# Patient Record
Sex: Female | Born: 1955 | Race: White | Hispanic: No | Marital: Married | Smoking: Never smoker
Health system: Southern US, Community
[De-identification: ages and names within clinical notes are randomized; demographics above are authoritative.]

## PROBLEM LIST (undated history)

## (undated) DIAGNOSIS — I10 Essential (primary) hypertension: Secondary | ICD-10-CM

## (undated) DIAGNOSIS — E119 Type 2 diabetes mellitus without complications: Secondary | ICD-10-CM

## (undated) DIAGNOSIS — E785 Hyperlipidemia, unspecified: Secondary | ICD-10-CM

## (undated) HISTORY — DX: Type 2 diabetes mellitus without complications: E11.9

## (undated) HISTORY — PX: WRIST SURGERY: SHX841

## (undated) HISTORY — PX: SPINE SURGERY: SHX786

## (undated) HISTORY — DX: Essential (primary) hypertension: I10

## (undated) HISTORY — PX: EYE SURGERY: SHX253

## (undated) HISTORY — DX: Hyperlipidemia, unspecified: E78.5

## (undated) HISTORY — PX: CHOLECYSTECTOMY: SHX55

---

## 2010-06-19 ENCOUNTER — Ambulatory Visit: Payer: Self-pay | Admitting: Internal Medicine

## 2018-10-16 LAB — CBC AND DIFFERENTIAL
HCT: 47 — AB (ref 36–46)
Hemoglobin: 15.2 (ref 12.0–16.0)
Neutrophils Absolute: 5
Platelets: 222 (ref 150–399)
WBC: 7.6

## 2018-10-16 LAB — BASIC METABOLIC PANEL
BUN: 10 (ref 4–21)
Creatinine: 0.7 (ref 0.5–1.1)
Glucose: 169
Potassium: 3.3 — AB (ref 3.4–5.3)
Sodium: 139 (ref 137–147)

## 2018-10-16 LAB — HEPATIC FUNCTION PANEL
ALT: 37 — AB (ref 7–35)
AST: 22 (ref 13–35)
Alkaline Phosphatase: 67 (ref 25–125)
Bilirubin, Total: 0.4

## 2018-10-16 LAB — LIPID PANEL
Cholesterol: 228 — AB (ref 0–200)
HDL: 43 (ref 35–70)
LDL Cholesterol: 158
Triglycerides: 136 (ref 40–160)

## 2018-11-12 LAB — FECAL OCCULT BLOOD, IMMUNOCHEMICAL: IFOBT: NEGATIVE

## 2019-12-09 ENCOUNTER — Encounter: Payer: Self-pay | Admitting: Adult Health

## 2019-12-09 ENCOUNTER — Other Ambulatory Visit: Payer: Self-pay

## 2019-12-09 ENCOUNTER — Ambulatory Visit (INDEPENDENT_AMBULATORY_CARE_PROVIDER_SITE_OTHER): Payer: Federal, State, Local not specified - PPO | Admitting: Adult Health

## 2019-12-09 VITALS — BP 141/81 | HR 91 | Temp 99.4°F | Ht 67.75 in | Wt 202.0 lb

## 2019-12-09 DIAGNOSIS — Z01419 Encounter for gynecological examination (general) (routine) without abnormal findings: Secondary | ICD-10-CM | POA: Diagnosis not present

## 2019-12-09 DIAGNOSIS — E119 Type 2 diabetes mellitus without complications: Secondary | ICD-10-CM | POA: Diagnosis not present

## 2019-12-09 DIAGNOSIS — IMO0002 Reserved for concepts with insufficient information to code with codable children: Secondary | ICD-10-CM | POA: Insufficient documentation

## 2019-12-09 DIAGNOSIS — Z0000 Encounter for general adult medical examination without abnormal findings: Secondary | ICD-10-CM | POA: Diagnosis not present

## 2019-12-09 DIAGNOSIS — F411 Generalized anxiety disorder: Secondary | ICD-10-CM

## 2019-12-09 DIAGNOSIS — I152 Hypertension secondary to endocrine disorders: Secondary | ICD-10-CM

## 2019-12-09 DIAGNOSIS — E1159 Type 2 diabetes mellitus with other circulatory complications: Secondary | ICD-10-CM

## 2019-12-09 DIAGNOSIS — I10 Essential (primary) hypertension: Secondary | ICD-10-CM

## 2019-12-09 DIAGNOSIS — Z79899 Other long term (current) drug therapy: Secondary | ICD-10-CM | POA: Diagnosis not present

## 2019-12-09 DIAGNOSIS — E1140 Type 2 diabetes mellitus with diabetic neuropathy, unspecified: Secondary | ICD-10-CM | POA: Insufficient documentation

## 2019-12-09 DIAGNOSIS — E1165 Type 2 diabetes mellitus with hyperglycemia: Secondary | ICD-10-CM | POA: Insufficient documentation

## 2019-12-09 LAB — POCT GLYCOSYLATED HEMOGLOBIN (HGB A1C): Hemoglobin A1C: 7.9 % — AB (ref 4.0–5.6)

## 2019-12-09 MED ORDER — METFORMIN HCL ER 500 MG PO TB24: ORAL_TABLET | ORAL | 0 refills | Status: DC

## 2019-12-09 MED ORDER — CYCLOBENZAPRINE HCL 10 MG PO TABS: 10.0000 mg | ORAL_TABLET | Freq: Three times a day (TID) | ORAL | 0 refills | Status: DC | PRN

## 2019-12-09 NOTE — Assessment & Plan Note (Signed)
Continue all medications as directed, with the following change- Metformin 500mg - take 2 tablets (1000mg ) in the morning, 1 tablet in the evening- take with meals. Please call your insurance and inquire which glucometer is covered- let us know which for [place] to call in. Remain well hydrated, follow Diabetic diet. Please schedule fasting lab appt on Monday (4 Jan), office visit in 6 weeks. Continue to social distance and wear a mask when in public.

## 2019-12-09 NOTE — Progress Notes (Signed)
Subjective:    Patient ID: Susan Sandoval, female    DOB: Sep 26, 1956, 63 y.o.   MRN: [id number]  HPI:Susan Sandoval is here to establish as a new pt.  She is a pleasant 63 yr old female.  PMH: HTN, T2D, Anxiety She was started on Metformin 500mg  QD last yr when she ws initially dx'd with diabetes- she is unsure of what her A1c was then and has not had it checked since then, She has not been checking her BG at home-does not have glucometer. She and her husband moved from [place] to local area 2 months ago. They were living in hotel, eating fast food, she was unable to exercise regularly- gained 5 lbs and reports increase in bil lower ext neuropathy around the same time. She has hx of discectomy/laminectomy L4-L5 1994 that will cause some RLE numbenss, "but not like this". Denies acute accident/injury prior to onset of sx'- however has been lifting heavy boxes ie recent move. She reports ambulatory BP SBP 130s DBP 70s She denies acute cardiac sx's Weather permitting- she enjoys walking and swimming. Now that they have moved into their new home- they will resume healthy, home cooking and increase regular walking. Goal wt <mid 190s Current wt 202 Body mass index is 30.94 kg/m.  Per pt- she is statin intolerant- caused PACs and "crawlings up my leg sensations"- discussed referral to lipid clinic. She reports long standing hx of Anxiety- not currently on medication, declined referral to BH/psychology. She is thrilled to have moved back to [place] to be closer to her daugther and her grandson. She is retired Dietitianpostmaster general   Patient Care Team    Relationship Specialty Notifications Start End  Tena Linebaugh, Jinny BlossomKaty D, NP PCP - General Family Medicine  12/09/19     Patient Active Problem List   Diagnosis Date Noted  . Hypertension associated with type 2 diabetes mellitus (HCC) 12/10/2019  . GAD (generalized anxiety disorder) 12/10/2019  . Type 2 diabetes mellitus with diabetic neuropathy, unspecified  (HCC) 12/10/2019  . Healthcare maintenance 12/09/2019  . Uncontrolled diabetes mellitus (HCC) 12/09/2019     Past Medical History:  Diagnosis Date  . Diabetes mellitus without complication (HCC)   . Hyperlipidemia   . Hypertension      Past Surgical History:  Procedure Laterality Date  . CESAREAN SECTION    . CHOLECYSTECTOMY    . EYE SURGERY     childhood  . SPINE SURGERY     discectomy/ laminectomy L4-L5  . WRIST SURGERY Right      Family History  Problem Relation Age of Onset  . Hypertension Mother   . Alzheimer's disease Mother   . Diabetes Father   . Hypertension Father   . Stroke Father   . Diabetes Sister   . Hypertension Sister   . Hypertension Maternal Grandmother   . Cancer Maternal Grandmother        intestinal   . Alcohol abuse Maternal Grandfather   . Stroke Maternal Grandfather   . Stroke Paternal Grandmother   . Hypertension Paternal Grandfather   . Stroke Paternal Grandfather      Social History   Substance and Sexual Activity  Drug Use Never     Social History   Substance and Sexual Activity  Alcohol Use Yes  . Alcohol/week: 2.0 standard drinks  . Types: 2 Glasses of wine per week     Social History   Tobacco Use  Smoking Status Never Smoker  Smokeless Tobacco Never Used  Outpatient Encounter Medications as of 12/09/2019  Medication Sig  . calcium carbonate (OS-CAL) 1250 (500 Ca) MG chewable tablet Chew 1 tablet by mouth daily.  . enalapril (VASOTEC) 5 MG tablet Take 5 mg by mouth daily.  . hydrochlorothiazide (HYDRODIURIL) 25 MG tablet Take 25 mg by mouth daily.  . metFORMIN (GLUCOPHAGE-XR) 500 MG 24 hr tablet 2 tabs in the morning, 1 tab in the evening  . [DISCONTINUED] metFORMIN (GLUCOPHAGE-XR) 500 MG 24 hr tablet Take 500 mg by mouth daily with breakfast.  . cyclobenzaprine (FLEXERIL) 10 MG tablet Take 1 tablet (10 mg total) by mouth 3 (three) times daily as needed for muscle spasms.   No facility-administered  encounter medications on file as of 12/09/2019.    Allergies: Statins  Body mass index is 30.94 kg/m.  Blood pressure (!) 141/81, pulse 91, temperature 99.4 F (37.4 C), temperature source Oral, height 5' 7.75" (1.721 m), weight 202 lb (91.6 kg), SpO2 97 %.     Review of Systems  Constitutional: Positive for fatigue. Negative for activity change, appetite change, chills, diaphoresis, fever and unexpected weight change.  Eyes: Negative for visual disturbance.  Respiratory: Negative for cough, chest tightness, shortness of breath, wheezing and stridor.   Cardiovascular: Negative for chest pain, palpitations and leg swelling.  Gastrointestinal: Negative for abdominal distention, anal bleeding, blood in stool, constipation and nausea.  Genitourinary: Negative for difficulty urinating and flank pain.  Neurological: Negative for dizziness and headaches.       Lower ext tingling bilaterally   Hematological: Negative for adenopathy. Does not bruise/bleed easily.  Psychiatric/Behavioral: Positive for sleep disturbance. Negative for agitation, behavioral problems, confusion, decreased concentration, dysphoric mood, hallucinations, self-injury and suicidal ideas. The patient is nervous/anxious. The patient is not hyperactive.        Objective:   Physical Exam Vitals and nursing note reviewed.  Constitutional:      General: She is not in acute distress.    Appearance: Normal appearance. She is obese. She is not ill-appearing, toxic-appearing or diaphoretic.  HENT:     Head: Normocephalic and atraumatic.  Eyes:     Extraocular Movements: Extraocular movements intact.     Conjunctiva/sclera: Conjunctivae normal.     Pupils: Pupils are equal, round, and reactive to light.  Cardiovascular:     Rate and Rhythm: Normal rate and regular rhythm.     Pulses: Normal pulses.     Heart sounds: Normal heart sounds.  Pulmonary:     Effort: Pulmonary effort is normal.     Breath sounds: Normal  breath sounds.  Musculoskeletal:        General: Tenderness present.     Right lower leg: No edema.     Left lower leg: No edema.  Skin:    General: Skin is warm and dry.  Neurological:     Mental Status: She is alert and oriented to person, place, and time.     Coordination: Coordination normal.  Psychiatric:        Attention and Perception: Attention and perception normal.        Mood and Affect: Mood is anxious.        Speech: Speech is rapid and pressured.        Behavior: Behavior normal.        Thought Content: Thought content normal.        Cognition and Memory: Cognition and memory normal.        Judgment: Judgment normal.  Assessment & Plan:   1. Encounter for well woman exam with routine gynecological exam   2. Diabetes mellitus without complication (HCC)   3. High risk medication use   4. Healthcare maintenance   5. Uncontrolled type 2 diabetes mellitus with hyperglycemia (HCC)   6. Hypertension associated with type 2 diabetes mellitus (HCC)   7. GAD (generalized anxiety disorder)   8. Type 2 diabetes mellitus with diabetic neuropathy, without long-term current use of insulin (HCC)     Healthcare maintenance Continue all medications as directed, with the following change- Metformin 500mg - take 2 tablets (1000mg ) in the morning, 1 tablet in the evening- take with meals. Please call your insurance and inquire which glucometer is covered- let know which for to call in. Remain well hydrated, follow Diabetic diet. Please schedule fasting lab appt on Monday (4 Jan), office visit in 6 weeks. Continue to social distance and wear a mask when in public.  Uncontrolled diabetes mellitus (HCC) Lab Results  Component Value Date   HGBA1C 7.9 (A) 12/09/2019   Continue all medications as directed, with the following change- Metformin 500mg - take 2 tablets (1000mg ) in the morning, 1 tablet in the evening- take with meals. Please call your insurance and inquire  which glucometer is covered- let 12-30-2005 know which for 12/11/2019 to call in. Remain well hydrated, follow Diabetic diet. Fasting labs ordered   GAD (generalized anxiety disorder) Increase exercise. Not currently on medication, declined referral to BH/psychology.  Hypertension associated with type 2 diabetes mellitus (HCC) She reports ambulatory BP SBP 130s DBP 70s She denies acute cardiac sx's Currently on Enalapil 5mg  QD, HCTZ 25mg  QD  Type 2 diabetes mellitus with diabetic neuropathy, unspecified (HCC) Lab Results  Component Value Date   HGBA1C 7.9 (A) 12/09/2019   Increase in sx's likely r/t to uncontrolled diabetes. Need CMP Increased Metformin from 500mg  QD to 1000mg  AQM , 500mg  PM If sxs's do not improve after A1c lowers- will further work-up  Hx of back surgery Denies acute accident/injury prior to onset of sx'- however has been lifting heavy boxes ie recent move    FOLLOW-UP:  Return in about 6 weeks (around 01/20/2020) for Regular Follow Up, HTN, Hypercholestermia, Diabetes.

## 2019-12-09 NOTE — Assessment & Plan Note (Signed)
Lab Results  Component Value Date   HGBA1C 7.9 (A) 12/09/2019   Continue all medications as directed, with the following change- Metformin 500mg - take 2 tablets (1000mg ) in the morning, 1 tablet in the evening- take with meals. Please call your insurance and inquire which glucometer is covered- let us know which for [place] to call in. Remain well hydrated, follow Diabetic diet. Fasting labs ordered

## 2019-12-09 NOTE — Patient Instructions (Addendum)
Diabetes Mellitus and Nutrition, Adult When you have diabetes (diabetes mellitus), it is very important to have healthy eating habits because your blood sugar (glucose) levels are greatly affected by what you eat and drink. Eating healthy foods in the appropriate amounts, at about the same times every day, can help you:  Control your blood glucose.  Lower your risk of heart disease.  Improve your blood pressure.  Reach or maintain a healthy weight. Every person with diabetes is different, and each person has different needs for a meal plan. Your health care provider may recommend that you work with a diet and nutrition specialist (dietitian) to make a meal plan that is best for you. Your meal plan may vary depending on factors such as:  The calories you need.  The medicines you take.  Your weight.  Your blood glucose, blood pressure, and cholesterol levels.  Your activity level.  Other health conditions you have, such as heart or kidney disease. How do carbohydrates affect me? Carbohydrates, also called carbs, affect your blood glucose level more than any other type of food. Eating carbs naturally raises the amount of glucose in your blood. Carb counting is a method for keeping track of how many carbs you eat. Counting carbs is important to keep your blood glucose at a healthy level, especially if you use insulin or take certain oral diabetes medicines. It is important to know how many carbs you can safely have in each meal. This is different for every person. Your dietitian can help you calculate how many carbs you should have at each meal and for each snack. Foods that contain carbs include:  Bread, cereal, rice, pasta, and crackers.  Potatoes and corn.  Peas, beans, and lentils.  Milk and yogurt.  Fruit and juice.  Desserts, such as cakes, cookies, ice cream, and candy. How does alcohol affect me? Alcohol can cause a sudden decrease in blood glucose (hypoglycemia),  especially if you use insulin or take certain oral diabetes medicines. Hypoglycemia can be a life-threatening condition. Symptoms of hypoglycemia (sleepiness, dizziness, and confusion) are similar to symptoms of having too much alcohol. If your health care provider says that alcohol is safe for you, follow these guidelines:  Limit alcohol intake to no more than 1 drink per day for nonpregnant women and 2 drinks per day for men. One drink equals 12 oz of beer, 5 oz of wine, or 1 oz of hard liquor.  Do not drink on an empty stomach.  Keep yourself hydrated with water, diet soda, or unsweetened iced tea.  Keep in mind that regular soda, juice, and other mixers may contain a lot of sugar and must be counted as carbs. What are tips for following this plan?  Reading food labels  Start by checking the serving size on the "Nutrition Facts" label of packaged foods and drinks. The amount of calories, carbs, fats, and other nutrients listed on the label is based on one serving of the item. Many items contain more than one serving per package.  Check the total grams (g) of carbs in one serving. You can calculate the number of servings of carbs in one serving by dividing the total carbs by 15. For example, if a food has 30 g of total carbs, it would be equal to 2 servings of carbs.  Check the number of grams (g) of saturated and trans fats in one serving. Choose foods that have low or no amount of these fats.  Check the number of   milligrams (mg) of salt (sodium) in one serving. Most people should limit total sodium intake to less than 2,300 mg per day.  Always check the nutrition information of foods labeled as "low-fat" or "nonfat". These foods may be higher in added sugar or refined carbs and should be avoided.  Talk to your dietitian to identify your daily goals for nutrients listed on the label. Shopping  Avoid buying canned, premade, or processed foods. These foods tend to be high in fat, sodium,  and added sugar.  Shop around the outside edge of the grocery store. This includes fresh fruits and vegetables, bulk grains, fresh meats, and fresh dairy. Cooking  Use low-heat cooking methods, such as baking, instead of high-heat cooking methods like deep frying.  Cook using healthy oils, such as olive, canola, or sunflower oil.  Avoid cooking with butter, cream, or high-fat meats. Meal planning  Eat meals and snacks regularly, preferably at the same times every day. Avoid going long periods of time without eating.  Eat foods high in fiber, such as fresh fruits, vegetables, beans, and whole grains. Talk to your dietitian about how many servings of carbs you can eat at each meal.  Eat 4-6 ounces (oz) of lean protein each day, such as lean meat, chicken, fish, eggs, or tofu. One oz of lean protein is equal to: ? 1 oz of meat, chicken, or fish. ? 1 egg. ?  cup of tofu.  Eat some foods each day that contain healthy fats, such as avocado, nuts, seeds, and fish. Lifestyle  Check your blood glucose regularly.  Exercise regularly as told by your health care provider. This may include: ? 150 minutes of moderate-intensity or vigorous-intensity exercise each week. This could be brisk walking, biking, or water aerobics. ? Stretching and doing strength exercises, such as yoga or weightlifting, at least 2 times a week.  Take medicines as told by your health care provider.  Do not use any products that contain nicotine or tobacco, such as cigarettes and e-cigarettes. If you need help quitting, ask your health care provider.  Work with a Social worker or diabetes educator to identify strategies to manage stress and any emotional and social challenges. Questions to ask a health care provider  Do I need to meet with a diabetes educator?  Do I need to meet with a dietitian?  What number can I call if I have questions?  When are the best times to check my blood glucose? Where to find more  information:  American Diabetes Association: diabetes.org  Academy of Nutrition and Dietetics: www.eatright.[employer] of Diabetes and Digestive and Kidney Diseases (NIH): [employer] Summary  A healthy meal plan will help you control your blood glucose and maintain a healthy lifestyle.  Working with a diet and nutrition specialist (dietitian) can help you make a meal plan that is best for you.  Keep in mind that carbohydrates (carbs) and alcohol have immediate effects on your blood glucose levels. It is important to count carbs and to use alcohol carefully. This information is not intended to replace advice given to you by your health care provider. Make sure you discuss any questions you have with your health care provider. Document Released: 08/23/2005 Document Revised: 11/08/2017 Document Reviewed: 12/31/2016 Elsevier Patient Education  [place].   Type 2 Diabetes Mellitus, Self Care, Adult When you have type 2 diabetes (type 2 diabetes mellitus), you must make sure your blood sugar (glucose) stays in a healthy range. You can do this  with:  Nutrition.  Exercise.  Lifestyle changes.  Medicines or insulin, if needed.  Support from your doctors and others. How to stay aware of blood sugar   Check your blood sugar level every day, as often as told.  Have your A1c (hemoglobin A1c) level checked two or more times a year. Have it checked more often if your doctor tells you to. Your doctor will set personal treatment goals for you. Generally, you should have these blood sugar levels:  Before meals (preprandial): 80-130 mg/dL (4.4-7.2 mmol/L).  After meals (postprandial): below 180 mg/dL (10 mmol/L).  A1c level: less than 7%. How to manage high and low blood sugar Signs of high blood sugar High blood sugar is called hyperglycemia. Know the signs of high blood sugar. Signs may include:  Feeling: ? Thirsty. ? Hungry. ? Very tired.  Needing to  pee (urinate) more than usual.  Blurry vision. Signs of low blood sugar Low blood sugar is called hypoglycemia. This is when blood sugar is at or below 70 mg/dL (3.9 mmol/L). Signs may include:  Feeling: ? Hungry. ? Worried or nervous (anxious). ? Sweaty and clammy. ? Confused. ? Dizzy. ? Sleepy. ? Sick to your stomach (nauseous).  Having: ? A fast heartbeat. ? A headache. ? A change in your vision. ? Jerky movements that you cannot control (seizure). ? Tingling or no feeling (numbness) around your mouth, lips, or tongue.  Having trouble with: ? Moving (coordination). ? Sleeping. ? Passing out (fainting). ? Getting upset easily (irritability). Treating low blood sugar To treat low blood sugar, eat or drink something sugary right away. If you can think clearly and swallow safely, follow the 15:15 rule:  Take 15 grams of a fast-acting carb (carbohydrate). Talk with your doctor about how much you should take.  Some fast-acting carbs are: ? Sugar tablets (glucose pills). Take 3-4 pills. ? 6-8 pieces of hard candy. ? 4-6 oz (120-150 mL) of fruit juice. ? 4-6 oz (120-150 mL) of regular (not diet) soda. ? 1 Tbsp (15 mL) honey or sugar.  Check your blood sugar 15 minutes after you take the carb.  If your blood sugar is still at or below 70 mg/dL (3.9 mmol/L), take 15 grams of a carb again.  If your blood sugar does not go above 70 mg/dL (3.9 mmol/L) after 3 tries, get help right away.  After your blood sugar goes back to normal, eat a meal or a snack within 1 hour. Treating very low blood sugar If your blood sugar is at or below 54 mg/dL (3 mmol/L), you have very low blood sugar (severe hypoglycemia). This is an emergency. Do not wait to see if the symptoms will go away. Get medical help right away. Call your local emergency services (911 in the [place] If you have very low blood sugar and you cannot eat or drink, you may need a glucagon shot (injection). A family member or  friend should learn how to check your blood sugar and how to give you a glucagon shot. Ask your doctor if you need to have a glucagon shot kit at home. Follow these instructions at home: Medicine  Take insulin and diabetes medicines as told.  If your doctor says you should take more or less insulin and medicines, do this exactly as told.  Do not run out of insulin or medicines. Having diabetes can raise your risk for other long-term conditions. These include heart disease and kidney disease. Your doctor may prescribe medicines to help  you not have these problems. Food   Make healthy food choices. These include: ? Chicken, fish, egg whites, and beans. ? Oats, whole wheat, bulgur, brown rice, quinoa, and millet. ? Fresh fruits and vegetables. ? Low-fat dairy products. ? Nuts, avocado, olive oil, and canola oil.  Meet with a food specialist (dietitian). He or she can help you make an eating plan that is right for you.  Follow instructions from your doctor about what you cannot eat or drink.  Drink enough fluid to keep your pee (urine) pale yellow.  Keep track of carbs that you eat. Do this by reading food labels and learning food serving sizes.  Follow your sick day plan when you cannot eat or drink normally. Make this plan with your doctor so it is ready to use. Activity  Exercise 3 or more times a week.  Do not go more than 2 days without exercising.  Talk with your doctor before you start a new exercise. Your doctor may need to tell you to change: ? How much insulin or medicines you take. ? How much food you eat. Lifestyle  Do not use any tobacco products. These include cigarettes, chewing tobacco, and e-cigarettes. If you need help quitting, ask your doctor.  Ask your doctor how much alcohol is safe for you.  Learn to deal with stress. If you need help with this, ask your doctor. Body care   Stay up to date with your shots (immunizations).  Have your eyes and feet  checked by a doctor as often as told.  Check your skin and feet every day. Check for cuts, bruises, redness, blisters, or sores.  Brush your teeth and gums two times a day. Floss one or more times a day.  Go to the dentist one or more times every 6 months.  Stay at a healthy weight. General instructions  Take over-the-counter and prescription medicines only as told by your doctor.  Share your diabetes care plan with: ? Your work or school. ? People you live with.  Carry a card or wear jewelry that says you have diabetes.  Keep all follow-up visits as told by your doctor. This is important. Questions to ask your doctor  Do I need to meet with a diabetes educator?  Where can I find a support group for people with diabetes? Where to find more information To learn more about diabetes, visit:  American Diabetes Association: www.diabetes.org  American Association of Diabetes Educators: www.diabeteseducator.org Summary  When you have type 2 diabetes, you must make sure your blood sugar (glucose) stays in a healthy range.  Check your blood sugar every day, as often as told.  Having diabetes can raise your risk for other conditions. Your doctor may prescribe medicines to help you not have these problems.  Keep all follow-up visits as told by your doctor. This is important. This information is not intended to replace advice given to you by your health care provider. Make sure you discuss any questions you have with your health care provider. Document Released: 03/19/2016 Document Revised: 05/19/2018 Document Reviewed: 12/30/2015 Elsevier Patient Education  2020 [place] all medications as directed, with the following change- Metformin '500mg'$ - take 2 tablets ('1000mg'$ ) in the morning, 1 tablet in the evening- take with meals. Please call your insurance and inquire which glucometer is covered- let us know which for [place] to call in. Remain well hydrated, follow Diabetic  diet. Please schedule fasting lab appt on Monday (4 Jan), office visit  in 6 weeks. Continue to social distance and wear a mask when in public. GREAT TO MEET YOU!

## 2019-12-10 DIAGNOSIS — E1159 Type 2 diabetes mellitus with other circulatory complications: Secondary | ICD-10-CM | POA: Insufficient documentation

## 2019-12-10 DIAGNOSIS — I152 Hypertension secondary to endocrine disorders: Secondary | ICD-10-CM | POA: Insufficient documentation

## 2019-12-10 DIAGNOSIS — E1140 Type 2 diabetes mellitus with diabetic neuropathy, unspecified: Secondary | ICD-10-CM | POA: Insufficient documentation

## 2019-12-10 DIAGNOSIS — F411 Generalized anxiety disorder: Secondary | ICD-10-CM | POA: Insufficient documentation

## 2019-12-10 NOTE — Assessment & Plan Note (Signed)
Increase exercise. Not currently on medication, declined referral to BH/psychology.

## 2019-12-10 NOTE — Assessment & Plan Note (Addendum)
>>  ASSESSMENT AND PLAN FOR TYPE 2 DIABETES MELLITUS WITH DIABETIC NEUROPATHY, UNSPECIFIED WRITTEN ON 12/10/2019  7:53 AM BY Lindberg Zenon D, NP  Lab Results  Component Value Date   HGBA1C 7.9 (A) 12/09/2019   Increase in sx's likely r/t to uncontrolled diabetes. Need CMP Increased Metformin from  QD to  AQM ,  PM If sxs's do not improve after A1c lowers- will further work-up  Hx of back surgery Denies acute accident/injury prior to onset of sx'- however has been lifting heavy boxes ie recent move  >>ASSESSMENT AND PLAN FOR UNCONTROLLED DIABETES MELLITUS WRITTEN ON 12/09/2019  3:29 PM BY [place] Blucher, Orpha Bur D, NP  Lab Results  Component Value Date   HGBA1C 7.9 (A) 12/09/2019   Continue all medications as directed, with the following change- Metformin - take 2 tablets ( ) in the morning, 1 tablet in the evening- take with meals. Please call your insurance and inquire which glucometer is covered- let us know which for [place] to call in. Remain well hydrated, follow Diabetic diet. Fasting labs ordered

## 2019-12-10 NOTE — Assessment & Plan Note (Signed)
She reports ambulatory BP SBP 130s DBP 70s She denies acute cardiac sx's Currently on Enalapil 5mg  QD, HCTZ 25mg  QD

## 2019-12-14 ENCOUNTER — Other Ambulatory Visit: Payer: Federal, State, Local not specified - PPO

## 2019-12-14 ENCOUNTER — Other Ambulatory Visit: Payer: Self-pay

## 2019-12-14 DIAGNOSIS — E039 Hypothyroidism, unspecified: Secondary | ICD-10-CM | POA: Diagnosis not present

## 2019-12-14 DIAGNOSIS — Z79899 Other long term (current) drug therapy: Secondary | ICD-10-CM | POA: Diagnosis not present

## 2019-12-14 DIAGNOSIS — E119 Type 2 diabetes mellitus without complications: Secondary | ICD-10-CM | POA: Diagnosis not present

## 2019-12-15 LAB — CBC WITH DIFFERENTIAL/PLATELET
Basophils Absolute: 0 10*3/uL (ref 0.0–0.2)
Basos: 1 %
EOS (ABSOLUTE): 0.1 10*3/uL (ref 0.0–0.4)
Eos: 1 %
Hematocrit: 44.5 % (ref 34.0–46.6)
Hemoglobin: 15.3 g/dL (ref 11.1–15.9)
Immature Grans (Abs): 0 10*3/uL (ref 0.0–0.1)
Immature Granulocytes: 0 %
Lymphocytes Absolute: 1.6 10*3/uL (ref 0.7–3.1)
Lymphs: 24 %
MCH: 31.3 pg (ref 26.6–33.0)
MCHC: 34.4 g/dL (ref 31.5–35.7)
MCV: 91 fL (ref 79–97)
Monocytes Absolute: 0.5 10*3/uL (ref 0.1–0.9)
Monocytes: 7 %
Neutrophils Absolute: 4.4 10*3/uL (ref 1.4–7.0)
Neutrophils: 67 %
Platelets: 204 10*3/uL (ref 150–450)
RBC: 4.89 x10E6/uL (ref 3.77–5.28)
RDW: 12.5 % (ref 11.7–15.4)
WBC: 6.5 10*3/uL (ref 3.4–10.8)

## 2019-12-15 LAB — COMPREHENSIVE METABOLIC PANEL
ALT: 40 IU/L — ABNORMAL HIGH (ref 0–32)
AST: 28 IU/L (ref 0–40)
Albumin/Globulin Ratio: 1.8 (ref 1.2–2.2)
Albumin: 4.5 g/dL (ref 3.8–4.8)
Alkaline Phosphatase: 57 IU/L (ref 39–117)
BUN/Creatinine Ratio: 19 (ref 12–28)
BUN: 13 mg/dL (ref 8–27)
Bilirubin Total: 0.3 mg/dL (ref 0.0–1.2)
CO2: 26 mmol/L (ref 20–29)
Calcium: 9.5 mg/dL (ref 8.7–10.3)
Chloride: 102 mmol/L (ref 96–106)
Creatinine, Ser: 0.7 mg/dL (ref 0.57–1.00)
GFR calc Af Amer: 107 mL/min/{1.73_m2} (ref >59)
GFR calc non Af Amer: 93 mL/min/{1.73_m2} (ref >59)
Globulin, Total: 2.5 g/dL (ref 1.5–4.5)
Glucose: 131 mg/dL — ABNORMAL HIGH (ref 65–99)
Potassium: 3.6 mmol/L (ref 3.5–5.2)
Sodium: 142 mmol/L (ref 134–144)
Total Protein: 7 g/dL (ref 6.0–8.5)

## 2019-12-15 LAB — LIPID PANEL
Chol/HDL Ratio: 4.1 ratio (ref 0.0–4.4)
Cholesterol, Total: 185 mg/dL (ref 100–199)
HDL: 45 mg/dL (ref >39)
LDL Chol Calc (NIH): 117 mg/dL — ABNORMAL HIGH (ref 0–99)
Triglycerides: 127 mg/dL (ref 0–149)
VLDL Cholesterol Cal: 23 mg/dL (ref 5–40)

## 2019-12-15 LAB — MAGNESIUM: Magnesium: 1.6 mg/dL (ref 1.6–2.3)

## 2019-12-15 LAB — TSH: TSH: 0.316 u[IU]/mL — ABNORMAL LOW (ref 0.450–4.500)

## 2019-12-16 ENCOUNTER — Telehealth: Payer: Self-pay | Admitting: Adult Health

## 2019-12-16 DIAGNOSIS — E1365 Other specified diabetes mellitus with hyperglycemia: Secondary | ICD-10-CM

## 2019-12-16 MED ORDER — GLUCOSE BLOOD VI STRP: ORAL_STRIP | 12 refills | Status: DC

## 2019-12-16 MED ORDER — CONTOUR NEXT EZ W/DEVICE KIT: 1.0000 | PACK | Freq: Every day | 0 refills | Status: DC

## 2019-12-16 MED ORDER — LANCETS 30G MISC: 1.0000 | Freq: Every day | 12 refills | Status: DC

## 2019-12-16 NOTE — Telephone Encounter (Signed)
Patient called back to inform us that her insurance's preferred glucose monitor is a "Contor Next EZ blood glucose monitor kit". If approved please send to [facility]

## 2019-12-16 NOTE — Telephone Encounter (Signed)
RXs sent to pharmacy.  T. Nelson, CMA 

## 2019-12-16 NOTE — Addendum Note (Signed)
Addended by: Stan Head on: 12/16/2019 04:50 PM   Modules accepted: Orders

## 2019-12-17 ENCOUNTER — Telehealth: Payer: Self-pay | Admitting: Adult Health

## 2019-12-17 DIAGNOSIS — E1365 Other specified diabetes mellitus with hyperglycemia: Secondary | ICD-10-CM

## 2019-12-17 MED ORDER — GLUCOSE BLOOD VI STRP: ORAL_STRIP | 12 refills | Status: DC

## 2019-12-17 MED ORDER — CONTOUR NEXT EZ W/DEVICE KIT: 1.0000 | PACK | Freq: Every day | 0 refills | Status: DC

## 2019-12-17 MED ORDER — LANCETS 30G MISC: 1.0000 | Freq: Every day | 12 refills | Status: DC

## 2019-12-17 NOTE — Telephone Encounter (Signed)
[name]. She is requesting to have this order resent to [facility] mail order.

## 2019-12-17 NOTE — Addendum Note (Signed)
Addended by: Stan Head on: 12/17/2019 02:38 PM   Modules accepted: Orders

## 2019-12-18 ENCOUNTER — Telehealth: Payer: Self-pay | Admitting: Adult Health

## 2019-12-18 DIAGNOSIS — E1140 Type 2 diabetes mellitus with diabetic neuropathy, unspecified: Secondary | ICD-10-CM

## 2019-12-18 LAB — T4, FREE: Free T4: 1.33 ng/dL (ref 0.82–1.77)

## 2019-12-18 LAB — SPECIMEN STATUS REPORT

## 2019-12-18 LAB — T3: T3, Total: 124 ng/dL (ref 71–180)

## 2019-12-18 NOTE — Telephone Encounter (Signed)
Message left by Debra @ [facility] stating pt changed Glucose Meter request to :  Accucheck  & needs supplies   Lancets ( softclix)  & guide test strips.  Order# [id number]   --They request provider call them w/ New Rx order @ [phone]# 2.  --Forwarding request to med asst.  glh

## 2019-12-21 MED ORDER — ACCU-CHEK AVIVA PLUS W/DEVICE KIT: PACK | 0 refills | Status: DC

## 2019-12-21 MED ORDER — ACCU-CHEK SOFTCLIX LANCETS MISC: 12 refills | Status: DC

## 2019-12-21 MED ORDER — ACCU-CHEK AVIVA PLUS VI STRP: ORAL_STRIP | 12 refills | Status: DC

## 2019-12-21 MED ORDER — ACCU-CHEK SOFTCLIX LANCET DEV KIT: PACK | 0 refills | Status: DC

## 2019-12-27 ENCOUNTER — Encounter: Payer: Self-pay | Admitting: Adult Health

## 2019-12-28 ENCOUNTER — Encounter: Payer: Self-pay | Admitting: Adult Health

## 2020-01-19 NOTE — Progress Notes (Signed)
Subjective:    Patient ID: MARJORY MEINTS, female    DOB: 1956/01/22, 64 y.o.   MRN: [id number]  HPI:12/09/2019 OV: Ms. Mukherjee is here to establish as a new pt.  She is a pleasant 64 yr old female.  PMH: HTN, T2D, Anxiety She was started on Metformin 512m QD last yr when she ws initially dx'd with diabetes- she is unsure of what her A1c was then and has not had it checked since then, She has not been checking her BG at home-does not have glucometer. [place]. They were living in hotel, eating fast food, she was unable to exercise regularly- gained 5 lbs and reports increase in bil lower ext neuropathy around the same time. She has hx of discectomy/laminectomy L4-L5 1994 that will cause some RLE numbenss, "but not like this". Denies acute accident/injury prior to onset of sx'- however has been lifting heavy boxes ie recent move. She reports ambulatory BP SBP 130s DBP 70s She denies acute cardiac sx's Weather permitting- she enjoys walking and swimming. Now that they have moved into their new home- they will resume healthy, home cooking and increase regular walking. Goal wt <mid 190s Current wt 202 Body mass index is 30.94 kg/m.  Per pt- she is statin intolerant- caused PACs and "crawlings up my leg sensations"- discussed referral to lipid clinic. She reports long standing hx of Anxiety- not currently on medication, declined referral to BH/psychology. She is thrilled to have moved back to [place] to be closer to her daugther and her grandson. She is retired pSales executivegeneral  01/20/2020 OV: Ms. SSchaperis here for regular f/u: HTN. HLD, T2D She reports AM BG 120-140 She denies episodes of hypoglycemia She is currently taking Metforming 5070m2 tabs in AM , 1 tab in PM She had some initial GI upset- has since resolved with "All Bran". She has been strictly following diabetic diet and walking >1 mile/day. She has lost >10 lbs (per home  scale) since last OV. Current wt (per home scale) 191 Ambulatory BP SBP 110-130 DBP 60-70 Currently taking Enalapril 64m52mD, HCTZ 264m62m She denies acute cardiac sx's She continues to abstain tobacco/vape/ETOH use. She reports slight reduction in R foot numbness. She denies tripping/falling.   Lab Results  Component Value Date   HGBA1C 7.9 (A) 12/09/2019   Patient Care Team    Relationship Specialty Notifications Start End  DanfEsaw Grandchild PCP - General Family Medicine  12/09/19     Patient Active Problem List   Diagnosis Date Noted  . Hypertension associated with type 2 diabetes mellitus (HCC)[place]/31/2020  . GAD (generalized anxiety disorder) 12/10/2019  . Type 2 diabetes mellitus with diabetic neuropathy, unspecified (HCC)[place]/31/2020  . Healthcare maintenance 12/09/2019  . Uncontrolled diabetes mellitus (HCC)[place]/30/2020     Past Medical History:  Diagnosis Date  . Diabetes mellitus without complication (HCC)[place]. Hyperlipidemia   . Hypertension      Past Surgical History:  Procedure Laterality Date  . CESAREAN SECTION    . CHOLECYSTECTOMY    . EYE SURGERY     childhood  . SPINE SURGERY     discectomy/ laminectomy L4-L5  . WRIST SURGERY Right      Family History  Problem Relation Age of Onset  . Hypertension Mother   . Alzheimer's disease Mother   . Diabetes Father   . Hypertension Father   . Stroke Father   .  Diabetes Sister   . Hypertension Sister   . Hypertension Maternal Grandmother   . Cancer Maternal Grandmother        intestinal   . Alcohol abuse Maternal Grandfather   . Stroke Maternal Grandfather   . Stroke Paternal Grandmother   . Hypertension Paternal Grandfather   . Stroke Paternal Grandfather      Social History   Substance and Sexual Activity  Drug Use Never     Social History   Substance and Sexual Activity  Alcohol Use Yes  . Alcohol/week: 2.0 standard drinks  . Types: 2 Glasses of wine per week     Social  History   Tobacco Use  Smoking Status Never Smoker  Smokeless Tobacco Never Used     Outpatient Encounter Medications as of 01/20/2020  Medication Sig  . Accu-Chek Softclix Lancets lancets Use to check blood sugars every morning fasting and 2 hours after largest meal  . Blood Glucose Monitoring Suppl (ACCU-CHEK AVIVA PLUS) w/Device KIT Use to check blood sugars every morning fasting and 2 hours after largest meal  . calcium carbonate (OS-CAL) 1250 (500 Ca) MG chewable tablet Chew 1 tablet by mouth daily.  . cyclobenzaprine (FLEXERIL) 10 MG tablet Take 1 tablet (10 mg total) by mouth 3 (three) times daily as needed for muscle spasms.  . enalapril (VASOTEC) 5 MG tablet Take 5 mg by mouth daily.  Marland Kitchen glucose blood (ACCU-CHEK AVIVA PLUS) test strip Use to check blood sugars every morning fasting and 2 hours after largest meal  . hydrochlorothiazide (HYDRODIURIL) 25 MG tablet Take 25 mg by mouth daily.  . Lancets Misc. (ACCU-CHEK SOFTCLIX LANCET DEV) KIT Use to check blood sugars every morning fasting and 2 hours after largest meal  . metFORMIN (GLUCOPHAGE-XR) 500 MG 24 hr tablet 2 tabs in the morning, 1 tab in the evening   No facility-administered encounter medications on file as of 01/20/2020.    Allergies: Statins  Body mass index is 30.27 kg/m.  Blood pressure (!) 144/76, pulse 68, temperature 98.1 F (36.7 C), temperature source Oral, height 5' 7.75" (1.721 m), weight 197 lb 9.6 oz (89.6 kg), SpO2 98 %.     Review of Systems  Constitutional: Positive for fatigue. Negative for activity change, appetite change, chills, diaphoresis, fever and unexpected weight change.  HENT: Negative for congestion.   Eyes: Negative for visual disturbance.  Respiratory: Negative for cough, chest tightness, shortness of breath, wheezing and stridor.   Cardiovascular: Negative for chest pain, palpitations and leg swelling.  Gastrointestinal: Negative for abdominal distention, abdominal pain, blood in  stool, constipation, diarrhea and nausea.  Endocrine: Negative for polydipsia, polyphagia and polyuria.  Genitourinary: Negative for difficulty urinating and flank pain.  Musculoskeletal: Negative for arthralgias, back pain, gait problem, joint swelling, myalgias, neck pain and neck stiffness.  Neurological: Negative for dizziness and headaches.  Hematological: Negative for adenopathy. Does not bruise/bleed easily.  Psychiatric/Behavioral: Negative for agitation, behavioral problems, confusion, decreased concentration, dysphoric mood, hallucinations, self-injury, sleep disturbance and suicidal ideas. The patient is not nervous/anxious and is not hyperactive.        Objective:   Physical Exam Vitals and nursing note reviewed.  Constitutional:      General: She is not in acute distress.    Appearance: Normal appearance. She is not ill-appearing, toxic-appearing or diaphoretic.  HENT:     Head: Normocephalic and atraumatic.  Eyes:     Conjunctiva/sclera: Conjunctivae normal.     Pupils: Pupils are equal, round, and reactive to  light.  Cardiovascular:     Rate and Rhythm: Normal rate and regular rhythm.     Pulses: Normal pulses.     Heart sounds: Normal heart sounds. No murmur. No friction rub. No gallop.   Pulmonary:     Effort: Pulmonary effort is normal. No respiratory distress.     Breath sounds: Normal breath sounds. No stridor. No wheezing, rhonchi or rales.  Chest:     Chest wall: No tenderness.  Skin:    General: Skin is warm.     Capillary Refill: Capillary refill takes less than 2 seconds.  Neurological:     Mental Status: She is alert and oriented to person, place, and time.     Motor: No weakness or tremor.     Coordination: Coordination normal.     Gait: Gait normal.     Deep Tendon Reflexes: Reflexes normal.  Psychiatric:        Mood and Affect: Mood normal.        Behavior: Behavior normal.        Thought Content: Thought content normal.        Judgment:  Judgment normal.       Assessment & Plan:   1. Type 2 diabetes mellitus with diabetic neuropathy, without long-term current use of insulin ([place])   2. Healthcare maintenance   3. Hypertension associated with type 2 diabetes mellitus ([place])   4. GAD (generalized anxiety disorder)     Healthcare maintenance Home blood sugars and blood pressure are at goal. [place] job on the healthy eating and weight loss. Continue to check levels at home. Please schedule A1c/CMP lab appt in 6 weeks. Please schedule office visit in 3 months. Continue to social distance and wear a mask when in public. YOU ARE DOING GREAT! GREAT TO SEE YOU!   Type 2 diabetes mellitus with diabetic neuropathy, unspecified ([place]) Lab Results  Component Value Date   HGBA1C 7.9 (A) 12/09/2019  She reports AM BG 120-140 She denies episodes of hypoglycemia She is currently taking Metforming 531m 2 tabs in AM , 1 tab in PM She had some initial GI upset- has since resolved with "All Bran". She has been strictly following diabetic diet and walking >1 mile/day.  She reports slight reduction in R foot numbness. She denies tripping/falling.  Hypertension associated with type 2 diabetes mellitus (HCC) Ambulatory BP SBP 110-130 DBP 60-70 Currently taking Enalapril 580mQD, HCTZ 2563mD She denies acute cardiac sx's  GAD (generalized anxiety disorder) Stable mood     FOLLOW-UP:  Return in about 3 months (around 04/18/2020) for HTN, Hypercholestermia, Diabetes, Regular Follow Up.

## 2020-01-20 ENCOUNTER — Other Ambulatory Visit: Payer: Self-pay

## 2020-01-20 ENCOUNTER — Ambulatory Visit (INDEPENDENT_AMBULATORY_CARE_PROVIDER_SITE_OTHER): Payer: Federal, State, Local not specified - PPO | Admitting: Adult Health

## 2020-01-20 ENCOUNTER — Encounter: Payer: Self-pay | Admitting: Adult Health

## 2020-01-20 VITALS — BP 144/76 | HR 68 | Temp 98.1°F | Ht 67.75 in | Wt 197.6 lb

## 2020-01-20 DIAGNOSIS — E1159 Type 2 diabetes mellitus with other circulatory complications: Secondary | ICD-10-CM

## 2020-01-20 DIAGNOSIS — E1140 Type 2 diabetes mellitus with diabetic neuropathy, unspecified: Secondary | ICD-10-CM | POA: Diagnosis not present

## 2020-01-20 DIAGNOSIS — F411 Generalized anxiety disorder: Secondary | ICD-10-CM

## 2020-01-20 DIAGNOSIS — I10 Essential (primary) hypertension: Secondary | ICD-10-CM

## 2020-01-20 DIAGNOSIS — Z0000 Encounter for general adult medical examination without abnormal findings: Secondary | ICD-10-CM

## 2020-01-20 DIAGNOSIS — I152 Hypertension secondary to endocrine disorders: Secondary | ICD-10-CM

## 2020-01-20 NOTE — Assessment & Plan Note (Signed)
Stable mood 

## 2020-01-20 NOTE — Assessment & Plan Note (Signed)
Home blood sugars and blood pressure are at goal. [place] job on the healthy eating and weight loss. Continue to check levels at home. Please schedule A1c/CMP lab appt in 6 weeks. Please schedule office visit in 3 months. Continue to social distance and wear a mask when in public. YOU ARE DOING GREAT! GREAT TO SEE YOU!

## 2020-01-20 NOTE — Patient Instructions (Addendum)

## 2020-01-20 NOTE — Assessment & Plan Note (Addendum)
Lab Results  Component Value Date   HGBA1C 7.9 (A) 12/09/2019  She reports AM BG 120-140 She denies episodes of hypoglycemia She is currently taking Metforming 500mg  2 tabs in AM , 1 tab in PM She had some initial GI upset- has since resolved with "All Bran". She has been strictly following diabetic diet and walking >1 mile/day.  She reports slight reduction in R foot numbness. She denies tripping/falling.

## 2020-01-20 NOTE — Assessment & Plan Note (Signed)
Ambulatory BP SBP 110-130 DBP 60-70 Currently taking Enalapril 5mg  QD, HCTZ 25mg  QD She denies acute cardiac sx's

## 2020-01-24 ENCOUNTER — Other Ambulatory Visit: Payer: Self-pay | Admitting: Adult Health

## 2020-01-25 MED ORDER — METFORMIN HCL ER 500 MG PO TB24: ORAL_TABLET | ORAL | 0 refills | Status: DC

## 2020-03-09 ENCOUNTER — Other Ambulatory Visit: Payer: Federal, State, Local not specified - PPO

## 2020-03-09 ENCOUNTER — Other Ambulatory Visit: Payer: Self-pay

## 2020-03-09 DIAGNOSIS — E1159 Type 2 diabetes mellitus with other circulatory complications: Secondary | ICD-10-CM

## 2020-03-09 DIAGNOSIS — E0865 Diabetes mellitus due to underlying condition with hyperglycemia: Secondary | ICD-10-CM | POA: Diagnosis not present

## 2020-03-09 DIAGNOSIS — I152 Hypertension secondary to endocrine disorders: Secondary | ICD-10-CM

## 2020-03-09 DIAGNOSIS — I10 Essential (primary) hypertension: Secondary | ICD-10-CM | POA: Diagnosis not present

## 2020-03-10 ENCOUNTER — Other Ambulatory Visit: Payer: Self-pay | Admitting: Family Medicine

## 2020-03-10 LAB — COMPREHENSIVE METABOLIC PANEL
ALT: 37 IU/L — ABNORMAL HIGH (ref 0–32)
AST: 21 IU/L (ref 0–40)
Albumin/Globulin Ratio: 2.2 (ref 1.2–2.2)
Albumin: 4.6 g/dL (ref 3.8–4.8)
Alkaline Phosphatase: 56 IU/L (ref 39–117)
BUN/Creatinine Ratio: 23 (ref 12–28)
BUN: 16 mg/dL (ref 8–27)
Bilirubin Total: 0.3 mg/dL (ref 0.0–1.2)
CO2: 26 mmol/L (ref 20–29)
Calcium: 9.7 mg/dL (ref 8.7–10.3)
Chloride: 103 mmol/L (ref 96–106)
Creatinine, Ser: 0.71 mg/dL (ref 0.57–1.00)
GFR calc Af Amer: 105 mL/min/{1.73_m2} (ref >59)
GFR calc non Af Amer: 91 mL/min/{1.73_m2} (ref >59)
Globulin, Total: 2.1 g/dL (ref 1.5–4.5)
Glucose: 121 mg/dL — ABNORMAL HIGH (ref 65–99)
Potassium: 3.8 mmol/L (ref 3.5–5.2)
Sodium: 143 mmol/L (ref 134–144)
Total Protein: 6.7 g/dL (ref 6.0–8.5)

## 2020-03-10 LAB — HEMOGLOBIN A1C
Est. average glucose Bld gHb Est-mCnc: 117 mg/dL
Hgb A1c MFr Bld: 5.7 % — ABNORMAL HIGH (ref 4.8–5.6)

## 2020-03-14 ENCOUNTER — Other Ambulatory Visit: Payer: Self-pay | Admitting: Family Medicine

## 2020-03-15 MED ORDER — METFORMIN HCL ER 500 MG PO TB24: ORAL_TABLET | ORAL | 0 refills | Status: DC

## 2020-05-10 ENCOUNTER — Other Ambulatory Visit: Payer: Self-pay

## 2020-05-10 ENCOUNTER — Ambulatory Visit: Payer: Federal, State, Local not specified - PPO | Admitting: Adult Health

## 2020-05-10 ENCOUNTER — Ambulatory Visit (INDEPENDENT_AMBULATORY_CARE_PROVIDER_SITE_OTHER): Payer: Federal, State, Local not specified - PPO | Admitting: Physician Assistant

## 2020-05-10 ENCOUNTER — Encounter: Payer: Self-pay | Admitting: Physician Assistant

## 2020-05-10 VITALS — BP 136/81 | HR 76 | Temp 98.0°F | Ht 67.75 in | Wt 179.4 lb

## 2020-05-10 DIAGNOSIS — L409 Psoriasis, unspecified: Secondary | ICD-10-CM | POA: Diagnosis not present

## 2020-05-10 DIAGNOSIS — E7800 Pure hypercholesterolemia, unspecified: Secondary | ICD-10-CM

## 2020-05-10 DIAGNOSIS — E1140 Type 2 diabetes mellitus with diabetic neuropathy, unspecified: Secondary | ICD-10-CM

## 2020-05-10 DIAGNOSIS — E1159 Type 2 diabetes mellitus with other circulatory complications: Secondary | ICD-10-CM | POA: Diagnosis not present

## 2020-05-10 DIAGNOSIS — R7989 Other specified abnormal findings of blood chemistry: Secondary | ICD-10-CM

## 2020-05-10 DIAGNOSIS — I10 Essential (primary) hypertension: Secondary | ICD-10-CM

## 2020-05-10 MED ORDER — HYDROCHLOROTHIAZIDE 12.5 MG PO TABS: 12.5000 mg | ORAL_TABLET | Freq: Every day | ORAL | 1 refills | Status: DC

## 2020-05-10 NOTE — Progress Notes (Signed)
Established Patient Office Visit  Subjective:  Patient ID: Susan Sandoval, female    DOB: December 14, 1955  Age: 64 y.o. MRN: [id number]  CC:  Chief Complaint  Patient presents with  . Hyperlipidemia  . Hypertension  . Diabetes    HPI Susan Sandoval presents for 3 month chronic follow-up on hypertension, elevated LDL, and T2DM.  HTN: Pt denies chest pain, palpitations, dizziness or lower extremity swelling. Taking medication as directed without side effects. Checks BP at home and brings in her BP log with average readings ranging in 101-120s/60-70s. Pt follows a low salt diet. She has made significant dietary and lifestyle changes, and has lost 18 pounds since last OV in 02/21.   Elevated LDL, diet controlled. Pt has made dietary changes and stays active with daily walks and activities such as kayaking. She has lost significant weight.   Diabetes: Pt denies increased urination or thirst. Pt reports medication compliance. No hypoglycemic events. Checking glucose at home. She has her annual eye exam scheduled for June 15th. She is unable to provide urine sample for microalbumin.   Psoriasis: Pt requesting refill of clobetasol. She has seen dermatology in the past for treatment of psoriasis. She currently has a scalp flare-up.   Low TSH: Last lab check TSH was low and thyroid hormones were normal.    Past Medical History:  Diagnosis Date  . Diabetes mellitus without complication ([place])   . Hyperlipidemia   . Hypertension     Past Surgical History:  Procedure Laterality Date  . CESAREAN SECTION    . CHOLECYSTECTOMY    . EYE SURGERY     childhood  . SPINE SURGERY     discectomy/ laminectomy L4-L5  . WRIST SURGERY Right     Family History  Problem Relation Age of Onset  . Hypertension Mother   . Alzheimer's disease Mother   . Diabetes Father   . Hypertension Father   . Stroke Father   . Diabetes Sister   . Hypertension Sister   . Hypertension Maternal Grandmother   . Cancer  Maternal Grandmother        intestinal   . Alcohol abuse Maternal Grandfather   . Stroke Maternal Grandfather   . Stroke Paternal Grandmother   . Hypertension Paternal Grandfather   . Stroke Paternal Grandfather     Social History   Socioeconomic History  . Marital status: Married    Spouse name: Not on file  . Number of children: Not on file  . Years of education: Not on file  . Highest education level: Not on file  Occupational History  . Not on file  Tobacco Use  . Smoking status: Never Smoker  . Smokeless tobacco: Never Used  Vaping Use  . Vaping Use: Never used  Substance and Sexual Activity  . Alcohol use: Yes    Alcohol/week: 2.0 standard drinks    Types: 2 Glasses of wine per week  . Drug use: Never  . Sexual activity: Yes  Other Topics Concern  . Not on file  Social History Narrative  . Not on file   Social Determinants of Health   Financial Resource Strain:   . Difficulty of Paying Living Expenses:   Food Insecurity:   . Worried About Charity fundraiser in the Last Year:   . Arboriculturist in the Last Year:   Transportation Needs:   . Film/video editor (Medical):   Marland Kitchen Lack of Transportation (Non-Medical):   Physical Activity:   .  Days of Exercise per Week:   . Minutes of Exercise per Session:   Stress:   . Feeling of Stress :   Social Connections:   . Frequency of Communication with Friends and Family:   . Frequency of Social Gatherings with Friends and Family:   . Attends Religious Services:   . Active Member of Clubs or Organizations:   . Attends Archivist Meetings:   Marland Kitchen Marital Status:   Intimate Partner Violence:   . Fear of Current or Ex-Partner:   . Emotionally Abused:   Marland Kitchen Physically Abused:   . Sexually Abused:     Outpatient Medications Prior to Visit  Medication Sig Dispense Refill  . Accu-Chek Softclix Lancets lancets Use to check blood sugars every morning fasting and 2 hours after largest meal 100 each 12  .  Blood Glucose Monitoring Suppl (ACCU-CHEK AVIVA PLUS) w/Device KIT Use to check blood sugars every morning fasting and 2 hours after largest meal 1 kit 0  . calcium carbonate (OS-CAL) 1250 (500 Ca) MG chewable tablet Chew 1 tablet by mouth daily.    . cyclobenzaprine (FLEXERIL) 10 MG tablet Take 1 tablet (10 mg total) by mouth 3 (three) times daily as needed for muscle spasms. 60 tablet 0  . enalapril (VASOTEC) 5 MG tablet Take 5 mg by mouth daily.    Marland Kitchen glucose blood (ACCU-CHEK AVIVA PLUS) test strip Use to check blood sugars every morning fasting and 2 hours after largest meal 100 each 12  . Lancets Misc. (ACCU-CHEK SOFTCLIX LANCET DEV) KIT Use to check blood sugars every morning fasting and 2 hours after largest meal 1 kit 0  . metFORMIN (GLUCOPHAGE-XR) 500 MG 24 hr tablet TAKE 2 TABLETS EVERY       MORNING AND 1 TABLET EVERY EVENING 270 tablet 0  . hydrochlorothiazide (HYDRODIURIL) 25 MG tablet Take 25 mg by mouth daily.     No facility-administered medications prior to visit.    Allergies  Allergen Reactions  . Statins Palpitations    "crawling in legs"    ROS Review of Systems  A fourteen system review of systems was performed and found to be positive as per HPI.    Objective:    Physical Exam General:  Well Developed, well nourished, appropriate for stated age.  Neuro:  Alert and oriented,  extra-ocular muscles intact  HEENT:  Normocephalic, atraumatic, neck supple, no carotid bruits appreciated  Skin:  no gross rash, warm, pink. Scalp plaques noted.  Cardiac:  RRR, S1 S2 Respiratory:  ECTA B/L and A/P, Not using accessory muscles, speaking in full sentences- unlabored. Vascular:  Ext warm, no cyanosis apprec.; cap RF less 2 sec. Psych:  No HI/SI, judgement and insight good, Euthymic mood. Full Affect.   BP 136/81   Pulse 76   Temp 98 F (36.7 C) (Oral)   Ht 5' 7.75" (1.721 m)   Wt 179 lb 6.4 oz (81.4 kg)   SpO2 96% Comment: on RA  BMI 27.48 kg/m  Wt Readings from  Last 3 Encounters:  05/10/20 179 lb 6.4 oz (81.4 kg)  01/20/20 197 lb 9.6 oz (89.6 kg)  12/09/19 202 lb (91.6 kg)     Health Maintenance Due  Topic Date Due  . PNEUMOCOCCAL POLYSACCHARIDE VACCINE AGE 43-64 HIGH RISK  Never done  . OPHTHALMOLOGY EXAM  Never done  . HIV Screening  Never done  . PAP SMEAR-Modifier  Never done  . MAMMOGRAM  Never done  . COLONOSCOPY  Never done  There are no preventive care reminders to display for this patient.  Lab Results  Component Value Date   TSH 0.316 (L) 12/14/2019   Lab Results  Component Value Date   WBC 6.5 12/14/2019   HGB 15.3 12/14/2019   HCT 44.5 12/14/2019   MCV 91 12/14/2019   PLT 204 12/14/2019   Lab Results  Component Value Date   NA 143 03/09/2020   K 3.8 03/09/2020   CO2 26 03/09/2020   GLUCOSE 121 (H) 03/09/2020   BUN 16 03/09/2020   CREATININE 0.71 03/09/2020   BILITOT 0.3 03/09/2020   ALKPHOS 56 03/09/2020   AST 21 03/09/2020   ALT 37 (H) 03/09/2020   PROT 6.7 03/09/2020   ALBUMIN 4.6 03/09/2020   CALCIUM 9.7 03/09/2020   Lab Results  Component Value Date   CHOL 185 12/14/2019   Lab Results  Component Value Date   HDL 45 12/14/2019   Lab Results  Component Value Date   LDLCALC 117 (H) 12/14/2019   Lab Results  Component Value Date   TRIG 127 12/14/2019   Lab Results  Component Value Date   CHOLHDL 4.1 12/14/2019   Lab Results  Component Value Date   HGBA1C 5.7 (H) 03/09/2020      Assessment & Plan:   Problem List Items Addressed This Visit      Cardiovascular and Mediastinum   Hypertension associated with type 2 diabetes mellitus ([place])    HTN: - BP today is 136/81, stable. - Per BP log, patient has several systolic readings in low 952W and is agreeable to reducing HCTZ dose to 12.5 mg. Continue Enalapril 5 mg once daily. Pt plans to continue weight loss which will also help control BP. - Continue ambulatory BP and pulse monitoring and keep a log. - Continue DASH diet. - Continue  to stay as active as possible.       Relevant Medications   hydrochlorothiazide (HYDRODIURIL) 12.5 MG tablet   Other Relevant Orders   Comp Met (CMET)     Endocrine   Type 2 diabetes mellitus with diabetic neuropathy, unspecified ([place]) - Primary    Diabetes: - Most recent A1c 5.7, at goal. - Continue Metformin 500 mg- 2 tablets in am and 1 tablet in pm - If A1c continues to remain at goal will consider decreasing Metformin dose. - Encourage ambulatory glucose monitoring and notify clinic if FBS consistently <80 or >160. - Follow low glucose/carbohydrate diet and continue to stay as active as possible. - Placed future lab order and will try to obtain microalbumin then.        Relevant Orders   Comp Met (CMET)   HgB A1c     Musculoskeletal and Integument   Psoriasis    - Advised patient to have pharmacy send RF request for clobetasol.        Other Visit Diagnoses    Elevated LDL cholesterol level       Relevant Orders   Lipid Profile   Low TSH level       Relevant Orders   TSH      Meds ordered this encounter  Medications  . hydrochlorothiazide (HYDRODIURIL) 12.5 MG tablet    Sig: Take 1 tablet (12.5 mg total) by mouth daily.    Dispense:  90 tablet    Refill:  1    Order Specific Question:   Supervising Provider    Answer:   Beatrice Lecher D [[id number]]   Elevated LDL cholesterol level: - Last lipid  panel wnl's with exception of elevated LDL- 117. - Continue heart healthy diet, physical activity, and weight loss. - Placed future lab order to recheck lipid panel in 5-6 weeks.   Low TSH level: - Last TSH was low at 0.316 and free T4 and T3 were normal. - Placed future lab order to recheck TSH.  - If continues to be low, then will place referral to endocrinology for further evaluation.    Follow-up: Return in about 4 months (around 09/09/2020) for DM, HTN, HLD; lab visit only in 4-5 weeks (lipid, A1c, cmp, tsh, microalbumin).    Lorrene Reid, PA-C

## 2020-05-10 NOTE — Patient Instructions (Signed)

## 2020-05-21 DIAGNOSIS — L409 Psoriasis, unspecified: Secondary | ICD-10-CM | POA: Insufficient documentation

## 2020-05-22 NOTE — Assessment & Plan Note (Signed)
Diabetes: - Most recent A1c 5.7, at goal. - Continue Metformin 500 mg- 2 tablets in am and 1 tablet in pm - If A1c continues to remain at goal will consider decreasing Metformin dose. - Encourage ambulatory glucose monitoring and notify clinic if FBS consistently <80 or >160. - Follow low glucose/carbohydrate diet and continue to stay as active as possible. - Placed future lab order and will try to obtain microalbumin then.

## 2020-05-22 NOTE — Assessment & Plan Note (Signed)
-   Advised patient to have pharmacy send RF request for clobetasol.

## 2020-05-22 NOTE — Assessment & Plan Note (Signed)
HTN: - BP today is 136/81, stable. - Per BP log, patient has several systolic readings in low 100s and is agreeable to reducing HCTZ dose to 12.5 mg. Continue Enalapril 5 mg once daily. Pt plans to continue weight loss which will also help control BP. - Continue ambulatory BP and pulse monitoring and keep a log. - Continue DASH diet. - Continue to stay as active as possible.

## 2020-05-24 LAB — HM DIABETES EYE EXAM

## 2020-06-10 ENCOUNTER — Other Ambulatory Visit: Payer: Federal, State, Local not specified - PPO

## 2020-06-14 ENCOUNTER — Other Ambulatory Visit: Payer: Self-pay

## 2020-06-14 ENCOUNTER — Other Ambulatory Visit: Payer: Self-pay | Admitting: Physician Assistant

## 2020-06-14 ENCOUNTER — Other Ambulatory Visit: Payer: Federal, State, Local not specified - PPO

## 2020-06-14 DIAGNOSIS — E1159 Type 2 diabetes mellitus with other circulatory complications: Secondary | ICD-10-CM | POA: Diagnosis not present

## 2020-06-14 DIAGNOSIS — I10 Essential (primary) hypertension: Secondary | ICD-10-CM | POA: Diagnosis not present

## 2020-06-14 DIAGNOSIS — E1140 Type 2 diabetes mellitus with diabetic neuropathy, unspecified: Secondary | ICD-10-CM

## 2020-06-14 DIAGNOSIS — E7800 Pure hypercholesterolemia, unspecified: Secondary | ICD-10-CM

## 2020-06-14 DIAGNOSIS — R7989 Other specified abnormal findings of blood chemistry: Secondary | ICD-10-CM

## 2020-06-14 DIAGNOSIS — I152 Hypertension secondary to endocrine disorders: Secondary | ICD-10-CM

## 2020-06-14 MED ORDER — CLOBETASOL PROPIONATE 0.05 % EX SOLN: 1.0000 "application " | Freq: Two times a day (BID) | CUTANEOUS | 1 refills | Status: DC

## 2020-06-14 NOTE — Telephone Encounter (Signed)
Pt called stating that RX for Cormax was sent to incorrect pharmacy and requests RX be sent to [facility] rather than [facility].  Cancelled RX at [facility] and resent to [facility].  Tiajuana Amass, CMA

## 2020-06-14 NOTE — Addendum Note (Signed)
Addended by: Sylvester Harder on: 06/14/2020 09:19 AM   Modules accepted: Orders

## 2020-06-14 NOTE — Telephone Encounter (Signed)
Patient is requesting a refill of her Cormax scalp med. If approved please send to [facility]. Archie Patten has bottle for reference of med.

## 2020-06-14 NOTE — Telephone Encounter (Signed)
Attempted to contact pt to inform of RX.  However, no answer and no VM.  Tiajuana Amass, CMA

## 2020-06-14 NOTE — Telephone Encounter (Signed)
This medication has not been prescribed by [place] in the past.   I have called patient to discuss. No answer.

## 2020-06-14 NOTE — Addendum Note (Signed)
Addended by: Stan Head on: 06/14/2020 09:49 AM   Modules accepted: Orders

## 2020-06-14 NOTE — Telephone Encounter (Signed)
Left message for patient to call back on cell phone. AS, CMA

## 2020-06-14 NOTE — Telephone Encounter (Signed)
Pt states that she previously discussed with you about getting refills for this medication.  Tiajuana Amass, CMA

## 2020-06-15 LAB — MICROALBUMIN / CREATININE URINE RATIO
Creatinine, Urine: 37.4 mg/dL
Microalb/Creat Ratio: <8 mg/g creat (ref 0–29)
Microalbumin, Urine: <3 ug/mL

## 2020-06-15 LAB — COMPREHENSIVE METABOLIC PANEL
ALT: 31 IU/L (ref 0–32)
AST: 17 IU/L (ref 0–40)
Albumin/Globulin Ratio: 1.7 (ref 1.2–2.2)
Albumin: 4.6 g/dL (ref 3.8–4.8)
Alkaline Phosphatase: 57 IU/L (ref 48–121)
BUN/Creatinine Ratio: 24 (ref 12–28)
BUN: 15 mg/dL (ref 8–27)
Bilirubin Total: 0.4 mg/dL (ref 0.0–1.2)
CO2: 24 mmol/L (ref 20–29)
Calcium: 9.6 mg/dL (ref 8.7–10.3)
Chloride: 102 mmol/L (ref 96–106)
Creatinine, Ser: 0.63 mg/dL (ref 0.57–1.00)
GFR calc Af Amer: 110 mL/min/{1.73_m2} (ref >59)
GFR calc non Af Amer: 95 mL/min/{1.73_m2} (ref >59)
Globulin, Total: 2.7 g/dL (ref 1.5–4.5)
Glucose: 109 mg/dL — ABNORMAL HIGH (ref 65–99)
Potassium: 4.1 mmol/L (ref 3.5–5.2)
Sodium: 142 mmol/L (ref 134–144)
Total Protein: 7.3 g/dL (ref 6.0–8.5)

## 2020-06-15 LAB — LIPID PANEL
Chol/HDL Ratio: 4.9 ratio — ABNORMAL HIGH (ref 0.0–4.4)
Cholesterol, Total: 245 mg/dL — ABNORMAL HIGH (ref 100–199)
HDL: 50 mg/dL (ref >39)
LDL Chol Calc (NIH): 166 mg/dL — ABNORMAL HIGH (ref 0–99)
Triglycerides: 158 mg/dL — ABNORMAL HIGH (ref 0–149)
VLDL Cholesterol Cal: 29 mg/dL (ref 5–40)

## 2020-06-15 LAB — HEMOGLOBIN A1C
Est. average glucose Bld gHb Est-mCnc: 108 mg/dL
Hgb A1c MFr Bld: 5.4 % (ref 4.8–5.6)

## 2020-06-15 LAB — TSH: TSH: 0.27 u[IU]/mL — ABNORMAL LOW (ref 0.450–4.500)

## 2020-06-16 DIAGNOSIS — R7989 Other specified abnormal findings of blood chemistry: Secondary | ICD-10-CM | POA: Insufficient documentation

## 2020-06-16 DIAGNOSIS — E0500 Thyrotoxicosis with diffuse goiter without thyrotoxic crisis or storm: Secondary | ICD-10-CM | POA: Insufficient documentation

## 2020-06-17 ENCOUNTER — Telehealth: Payer: Self-pay | Admitting: Physician Assistant

## 2020-06-17 DIAGNOSIS — E1140 Type 2 diabetes mellitus with diabetic neuropathy, unspecified: Secondary | ICD-10-CM

## 2020-06-17 LAB — T3: T3, Total: 125 ng/dL (ref 71–180)

## 2020-06-17 LAB — SPECIMEN STATUS REPORT

## 2020-06-17 LAB — T4, FREE: Free T4: 1.34 ng/dL (ref 0.82–1.77)

## 2020-06-17 MED ORDER — METFORMIN HCL 500 MG PO TABS: 500.0000 mg | ORAL_TABLET | Freq: Two times a day (BID) | ORAL | 1 refills | Status: DC

## 2020-06-18 ENCOUNTER — Encounter: Payer: Self-pay | Admitting: Physician Assistant

## 2020-06-18 DIAGNOSIS — E7800 Pure hypercholesterolemia, unspecified: Secondary | ICD-10-CM

## 2020-06-19 ENCOUNTER — Encounter: Payer: Self-pay | Admitting: Physician Assistant

## 2020-06-19 DIAGNOSIS — E1140 Type 2 diabetes mellitus with diabetic neuropathy, unspecified: Secondary | ICD-10-CM

## 2020-06-21 ENCOUNTER — Other Ambulatory Visit: Payer: Self-pay

## 2020-06-21 ENCOUNTER — Telehealth: Payer: Self-pay

## 2020-06-21 DIAGNOSIS — E0590 Thyrotoxicosis, unspecified without thyrotoxic crisis or storm: Secondary | ICD-10-CM

## 2020-06-21 MED ORDER — METFORMIN HCL 500 MG PO TABS: 500.0000 mg | ORAL_TABLET | Freq: Two times a day (BID) | ORAL | 1 refills | Status: DC

## 2020-06-21 NOTE — Telephone Encounter (Signed)
-----   Message from Mayer Masker, [place] sent at 06/21/2020 10:19 AM EDT ----- Olena Heckle,  Please contact [facility] and cancel rx for Metformin.   Thank you, Kandis Cocking

## 2020-06-21 NOTE — Telephone Encounter (Signed)
Spoke with [place] at [facility] and cancelled RX for metformin.  Tiajuana Amass, CMA

## 2020-06-23 MED ORDER — FENOFIBRATE 50 MG PO CAPS: 50.0000 mg | ORAL_CAPSULE | Freq: Every day | ORAL | 0 refills | Status: DC

## 2020-08-04 ENCOUNTER — Other Ambulatory Visit: Payer: Self-pay | Admitting: Physician Assistant

## 2020-08-04 DIAGNOSIS — E7800 Pure hypercholesterolemia, unspecified: Secondary | ICD-10-CM

## 2020-09-01 ENCOUNTER — Encounter: Payer: Self-pay | Admitting: Physician Assistant

## 2020-09-06 DIAGNOSIS — E0590 Thyrotoxicosis, unspecified without thyrotoxic crisis or storm: Secondary | ICD-10-CM | POA: Diagnosis not present

## 2020-09-06 DIAGNOSIS — Z6827 Body mass index (BMI) 27.0-27.9, adult: Secondary | ICD-10-CM | POA: Diagnosis not present

## 2020-09-15 ENCOUNTER — Other Ambulatory Visit: Payer: Self-pay

## 2020-09-15 ENCOUNTER — Encounter: Payer: Self-pay | Admitting: Physician Assistant

## 2020-09-15 ENCOUNTER — Ambulatory Visit (INDEPENDENT_AMBULATORY_CARE_PROVIDER_SITE_OTHER): Payer: Federal, State, Local not specified - PPO | Admitting: Physician Assistant

## 2020-09-15 VITALS — BP 133/79 | HR 69 | Ht 67.75 in | Wt 178.5 lb

## 2020-09-15 DIAGNOSIS — E1169 Type 2 diabetes mellitus with other specified complication: Secondary | ICD-10-CM | POA: Diagnosis not present

## 2020-09-15 DIAGNOSIS — E1159 Type 2 diabetes mellitus with other circulatory complications: Secondary | ICD-10-CM

## 2020-09-15 DIAGNOSIS — I152 Hypertension secondary to endocrine disorders: Secondary | ICD-10-CM

## 2020-09-15 DIAGNOSIS — E785 Hyperlipidemia, unspecified: Secondary | ICD-10-CM

## 2020-09-15 DIAGNOSIS — E1140 Type 2 diabetes mellitus with diabetic neuropathy, unspecified: Secondary | ICD-10-CM

## 2020-09-15 LAB — POCT GLYCOSYLATED HEMOGLOBIN (HGB A1C): Hemoglobin A1C: 5.1 % (ref 4.0–5.6)

## 2020-09-15 NOTE — Assessment & Plan Note (Signed)
-   BP stable - Continue current medication regimen. - Continue ambulatory BP and pulse monitoring and keep a log. - Follow DASH diet. - Continue physical activity level.

## 2020-09-15 NOTE — Assessment & Plan Note (Addendum)
-  A1c continues to be at goal, decreased from 5.4 to 5.1 so discussed with patient discontinuing Metformin to reduce with the risk of hypoglycemic events.  Patient verbalized understanding and is agreeable. -Continue ambulatory glucose monitoring.  Discussed with patient if starts noticing fasting blood sugars increase (>120) then recommend restarting Metformin at 500 mg once daily.  Patient verbalized understanding. -Continue low carbohydrate and glucose diet. -Will request most recent eye exam. -Will continue to monitor.

## 2020-09-15 NOTE — Patient Instructions (Signed)
Heart-Healthy Eating Plan Many factors influence your heart (coronary) health, including eating and exercise habits. Coronary risk increases with abnormal blood fat (lipid) levels. Heart-healthy meal planning includes limiting unhealthy fats, increasing healthy fats, and making other diet and lifestyle changes. What is my plan? Your health care provider may recommend that you:  Limit your fat intake to _________% or less of your total calories each day.  Limit your saturated fat intake to _________% or less of your total calories each day.  Limit the amount of cholesterol in your diet to less than _________ mg per day. What are tips for following this plan? Cooking Cook foods using methods other than frying. Baking, boiling, grilling, and broiling are all good options. Other ways to reduce fat include:  Removing the skin from poultry.  Removing all visible fats from meats.  Steaming vegetables in water or broth. Meal planning   At meals, imagine dividing your plate into fourths: ? Fill one-half of your plate with vegetables and green salads. ? Fill one-fourth of your plate with whole grains. ? Fill one-fourth of your plate with lean protein foods.  Eat 4-5 servings of vegetables per day. One serving equals 1 cup raw or cooked vegetable, or 2 cups raw leafy greens.  Eat 4-5 servings of fruit per day. One serving equals 1 medium whole fruit,  cup dried fruit,  cup fresh, frozen, or canned fruit, or  cup 100% fruit juice.  Eat more foods that contain soluble fiber. Examples include apples, broccoli, carrots, beans, peas, and barley. Aim to get 25-30 g of fiber per day.  Increase your consumption of legumes, nuts, and seeds to 4-5 servings per week. One serving of dried beans or legumes equals  cup cooked, 1 serving of nuts is  cup, and 1 serving of seeds equals 1 tablespoon. Fats  Choose healthy fats more often. Choose monounsaturated and polyunsaturated fats, such as olive and  canola oils, flaxseeds, walnuts, almonds, and seeds.  Eat more omega-3 fats. Choose salmon, mackerel, sardines, tuna, flaxseed oil, and ground flaxseeds. Aim to eat fish at least 2 times each week.  Check food labels carefully to identify foods with trans fats or high amounts of saturated fat.  Limit saturated fats. These are found in animal products, such as meats, butter, and cream. Plant sources of saturated fats include palm oil, palm kernel oil, and coconut oil.  Avoid foods with partially hydrogenated oils in them. These contain trans fats. Examples are stick margarine, some tub margarines, cookies, crackers, and other baked goods.  Avoid fried foods. General information  Eat more home-cooked food and less restaurant, buffet, and fast food.  Limit or avoid alcohol.  Limit foods that are high in starch and sugar.  Lose weight if you are overweight. Losing just 5-10% of your body weight can help your overall health and prevent diseases such as diabetes and heart disease.  Monitor your salt (sodium) intake, especially if you have high blood pressure. Talk with your health care provider about your sodium intake.  Try to incorporate more vegetarian meals weekly. What foods can I eat? Fruits All fresh, canned (in natural juice), or frozen fruits. Vegetables Fresh or frozen vegetables (raw, steamed, roasted, or grilled). Green salads. Grains Most grains. Choose whole wheat and whole grains most of the time. Rice and pasta, including brown rice and pastas made with whole wheat. Meats and other proteins Lean, well-trimmed beef, veal, pork, and lamb. Chicken and turkey without skin. All fish and shellfish. Wild   duck, rabbit, pheasant, and venison. Egg whites or low-cholesterol egg substitutes. Dried beans, peas, lentils, and tofu. Seeds and most nuts. Dairy Low-fat or nonfat cheeses, including ricotta and mozzarella. Skim or 1% milk (liquid, powdered, or evaporated). Buttermilk made  with low-fat milk. Nonfat or low-fat yogurt. Fats and oils Non-hydrogenated (trans-free) margarines. Vegetable oils, including soybean, sesame, sunflower, olive, peanut, safflower, corn, canola, and cottonseed. Salad dressings or mayonnaise made with a vegetable oil. Beverages Water (mineral or sparkling). Coffee and tea. Diet carbonated beverages. Sweets and desserts Sherbet, gelatin, and fruit ice. Small amounts of dark chocolate. Limit all sweets and desserts. Seasonings and condiments All seasonings and condiments. The items listed above may not be a complete list of foods and beverages you can eat. Contact a dietitian for more options. What foods are not recommended? Fruits Canned fruit in heavy syrup. Fruit in cream or butter sauce. Fried fruit. Limit coconut. Vegetables Vegetables cooked in cheese, cream, or butter sauce. Fried vegetables. Grains Breads made with saturated or trans fats, oils, or whole milk. Croissants. Sweet rolls. Donuts. High-fat crackers, such as cheese crackers. Meats and other proteins Fatty meats, such as hot dogs, ribs, sausage, bacon, rib-eye roast or steak. High-fat deli meats, such as salami and bologna. Caviar. Domestic duck and goose. Organ meats, such as liver. Dairy Cream, sour cream, cream cheese, and creamed cottage cheese. Whole milk cheeses. Whole or 2% milk (liquid, evaporated, or condensed). Whole buttermilk. Cream sauce or high-fat cheese sauce. Whole-milk yogurt. Fats and oils Meat fat, or shortening. Cocoa butter, hydrogenated oils, palm oil, coconut oil, palm kernel oil. Solid fats and shortenings, including bacon fat, salt pork, lard, and butter. Nondairy cream substitutes. Salad dressings with cheese or sour cream. Beverages Regular sodas and any drinks with added sugar. Sweets and desserts Frosting. Pudding. Cookies. Cakes. Pies. Milk chocolate or white chocolate. Buttered syrups. Full-fat ice cream or ice cream drinks. The items listed  above may not be a complete list of foods and beverages to avoid. Contact a dietitian for more information. Summary  Heart-healthy meal planning includes limiting unhealthy fats, increasing healthy fats, and making other diet and lifestyle changes.  Lose weight if you are overweight. Losing just 5-10% of your body weight can help your overall health and prevent diseases such as diabetes and heart disease.  Focus on eating a balance of foods, including fruits and vegetables, low-fat or nonfat dairy, lean protein, nuts and legumes, whole grains, and heart-healthy oils and fats. This information is not intended to replace advice given to you by your health care provider. Make sure you discuss any questions you have with your health care provider. Document Revised: 01/03/2018 Document Reviewed: 01/03/2018 Elsevier Patient Education  2020 Elsevier Inc.   High Cholesterol  High cholesterol is a condition in which the blood has high levels of a white, waxy, fat-like substance (cholesterol). The human body needs small amounts of cholesterol. The liver makes all the cholesterol that the body needs. Extra (excess) cholesterol comes from the food that we eat. Cholesterol is carried from the liver by the blood through the blood vessels. If you have high cholesterol, deposits (plaques) may build up on the walls of your blood vessels (arteries). Plaques make the arteries narrower and stiffer. Cholesterol plaques increase your risk for heart attack and stroke. Work with your health care provider to keep your cholesterol levels in a healthy range. What increases the risk? This condition is more likely to develop in people who:  Eat foods that are  high in animal fat (saturated fat) or cholesterol.  Are overweight.  Are not getting enough exercise.  Have a family history of high cholesterol. What are the signs or symptoms? There are no symptoms of this condition. How is this diagnosed? This condition  may be diagnosed from the results of a blood test.  If you are older than age 53, your health care provider may check your cholesterol every 4-6 years.  You may be checked more often if you already have high cholesterol or other risk factors for heart disease. The blood test for cholesterol measures:  "Bad" cholesterol (LDL cholesterol). This is the main type of cholesterol that causes heart disease. The desired level for LDL is less than 100.  "Good" cholesterol (HDL cholesterol). This type helps to protect against heart disease by cleaning the arteries and carrying the LDL away. The desired level for HDL is 60 or higher.  Triglycerides. These are fats that the body can store or burn for energy. The desired number for triglycerides is lower than 150.  Total cholesterol. This is a measure of the total amount of cholesterol in your blood, including LDL cholesterol, HDL cholesterol, and triglycerides. A healthy number is less than 200. How is this treated? This condition is treated with diet changes, lifestyle changes, and medicines. Diet changes  This may include eating more whole grains, fruits, vegetables, nuts, and fish.  This may also include cutting back on red meat and foods that have a lot of added sugar. Lifestyle changes  Changes may include getting at least 40 minutes of aerobic exercise 3 times a week. Aerobic exercises include walking, biking, and swimming. Aerobic exercise along with a healthy diet can help you maintain a healthy weight.  Changes may also include quitting smoking. Medicines  Medicines are usually given if diet and lifestyle changes have failed to reduce your cholesterol to healthy levels.  Your health care provider may prescribe a statin medicine. Statin medicines have been shown to reduce cholesterol, which can reduce the risk of heart disease. Follow these instructions at home: Eating and drinking If told by your health care provider:  Eat chicken  (without skin), fish, veal, shellfish, ground [place] breast, and round or loin cuts of red meat.  Do not eat fried foods or fatty meats, such as hot dogs and salami.  Eat plenty of fruits, such as apples.  Eat plenty of vegetables, such as broccoli, potatoes, and carrots.  Eat beans, peas, and lentils.  Eat grains such as barley, rice, couscous, and bulgur wheat.  Eat pasta without cream sauces.  Use skim or nonfat milk, and eat low-fat or nonfat yogurt and cheeses.  Do not eat or drink whole milk, cream, ice cream, egg yolks, or hard cheeses.  Do not eat stick margarine or tub margarines that contain trans fats (also called partially hydrogenated oils).  Do not eat saturated tropical oils, such as coconut oil and palm oil.  Do not eat cakes, cookies, crackers, or other baked goods that contain trans fats.  General instructions  Exercise as directed by your health care provider. Increase your activity level with activities such as gardening, walking, and taking the stairs.  Take over-the-counter and prescription medicines only as told by your health care provider.  Do not use any products that contain nicotine or tobacco, such as cigarettes and e-cigarettes. If you need help quitting, ask your health care provider.  Keep all follow-up visits as told by your health care provider. This is important.  Contact a health care provider if:  You are struggling to maintain a healthy diet or weight.  You need help to start on an exercise program.  You need help to stop smoking. Get help right away if:  You have chest pain.  You have trouble breathing. This information is not intended to replace advice given to you by your health care provider. Make sure you discuss any questions you have with your health care provider. Document Revised: 11/29/2017 Document Reviewed: 05/26/2016 Elsevier Patient Education  2020 [employer].

## 2020-09-15 NOTE — Progress Notes (Signed)
Established Patient Office Visit  Subjective:  Patient ID: Susan Sandoval, female    DOB: 1956/05/17  Age: 64 y.o. MRN: [id number]  CC:  Chief Complaint  Patient presents with   Hypertension   Diabetes   Hyperlipidemia    HPI Susan Sandoval presents for follow up on diabetes mellitus, hyperlipidemia, and hypertension. Reports she recently obtained her flu vaccine and Covid booster.  Diabetes: Pt denies increased urination or thirst. Pt reports medication compliance and states depending what her glucose is in the evening she would skip evening Metformin dose to reduce hypoglycemia. Does report some hypoglycemic events. Checking glucose at home. Brings her glucose log and FBS range 88-114, PPS 98-167. She continues to monitor carbohydrates and glucose.  HLD: Patient developed petechia from medication and discontinued. Reports she has not been as active but plans to resume.  HTN: Pt denies chest pain, palpitations, dizziness or lower extremity swelling. Taking medication as directed without side effects. Checks BP at home and brings BP log and readings range 94-129/62-81. Pt follows a low salt diet.   Past Medical History:  Diagnosis Date   Diabetes mellitus without complication ([place])    Hyperlipidemia    Hypertension     Past Surgical History:  Procedure Laterality Date   CESAREAN SECTION     CHOLECYSTECTOMY     EYE SURGERY     childhood   SPINE SURGERY     discectomy/ laminectomy L4-L5   WRIST SURGERY Right     Family History  Problem Relation Age of Onset   Hypertension Mother    Alzheimer's disease Mother    Diabetes Father    Hypertension Father    Stroke Father    Diabetes Sister    Hypertension Sister    Hypertension Maternal Grandmother    Cancer Maternal Grandmother        intestinal    Alcohol abuse Maternal Grandfather    Stroke Maternal Grandfather    Stroke Paternal Grandmother    Hypertension Paternal Grandfather    Stroke  Paternal Grandfather     Social History   Socioeconomic History   Marital status: Married    Spouse name: Not on file   Number of children: Not on file   Years of education: Not on file   Highest education level: Not on file  Occupational History   Not on file  Tobacco Use   Smoking status: Never Smoker   Smokeless tobacco: Never Used  Vaping Use   Vaping Use: Never used  Substance and Sexual Activity   Alcohol use: Yes    Alcohol/week: 2.0 standard drinks    Types: 2 Glasses of wine per week   Drug use: Never   Sexual activity: Yes  Other Topics Concern   Not on file  Social History Narrative   Not on file   Social Determinants of Health   Financial Resource Strain:    Difficulty of Paying Living Expenses: Not on file  Food Insecurity:    Worried About [place] in the Last Year: Not on file   Ran Out of Food in the Last Year: Not on file  Transportation Needs:    Lack of Transportation (Medical): Not on file   Lack of Transportation (Non-Medical): Not on file  Physical Activity:    Days of Exercise per Week: Not on file   Minutes of Exercise per Session: Not on file  Stress:    Feeling of Stress : Not on file  Social Connections:    Frequency of Communication with Friends and Family: Not on file   Frequency of Social Gatherings with Friends and Family: Not on file   Attends Religious Services: Not on Electrical engineer or Organizations: Not on file   Attends Archivist Meetings: Not on file   Marital Status: Not on file  Intimate Partner Violence:    Fear of Current or Ex-Partner: Not on file   Emotionally Abused: Not on file   Physically Abused: Not on file   Sexually Abused: Not on file    Outpatient Medications Prior to Visit  Medication Sig Dispense Refill   Accu-Chek Softclix Lancets lancets Use to check blood sugars every morning fasting and 2 hours after largest meal 100 each 12    Blood Glucose Monitoring Suppl (ACCU-CHEK AVIVA PLUS) w/Device KIT Use to check blood sugars every morning fasting and 2 hours after largest meal 1 kit 0   clobetasol (CORMAX SCALP APPLICATION) 0.96 % external solution Apply 1 application topically 2 (two) times daily. 50 mL 1   cyclobenzaprine (FLEXERIL) 10 MG tablet Take 1 tablet (10 mg total) by mouth 3 (three) times daily as needed for muscle spasms. 60 tablet 0   enalapril (VASOTEC) 5 MG tablet Take 5 mg by mouth daily.     glucose blood (ACCU-CHEK AVIVA PLUS) test strip Use to check blood sugars every morning fasting and 2 hours after largest meal 100 each 12   hydrochlorothiazide (HYDRODIURIL) 12.5 MG tablet Take 1 tablet (12.5 mg total) by mouth daily. 90 tablet 1   Lancets Misc. (ACCU-CHEK SOFTCLIX LANCET DEV) KIT Use to check blood sugars every morning fasting and 2 hours after largest meal 1 kit 0   metFORMIN (GLUCOPHAGE) 500 MG tablet Take 1 tablet (500 mg total) by mouth 2 (two) times daily with a meal. 180 tablet 1   metFORMIN (GLUCOPHAGE) 500 MG tablet Take 1 tablet (500 mg total) by mouth 2 (two) times daily with a meal. 180 tablet 1   calcium carbonate (OS-CAL) 1250 (500 Ca) MG chewable tablet Chew 1 tablet by mouth daily. (Patient not taking: Reported on 09/15/2020)     Fenofibrate 50 MG CAPS TAKE 1 CAPSULE DAILY (Patient not taking: Reported on 09/15/2020) 90 capsule 0   No facility-administered medications prior to visit.    Allergies  Allergen Reactions   Statins Palpitations    "crawling in legs"    ROS Review of Systems Review of Systems:  A fourteen system review of systems was performed and found to be positive as per HPI.  Objective:    Physical Exam General:  Well Developed, well nourished, appropriate for stated age.  Neuro:  Alert and oriented,  extra-ocular muscles intact, no focal deficits  HEENT:  Normocephalic, atraumatic, neck supple Skin:  Warm, dry. Minimal petechia of lower extremity   Cardiac:  RRR, S1 S2 Respiratory:  ECTA B/L and A/P, Not using accessory muscles, speaking in full sentences- unlabored. Vascular:  Ext warm, no cyanosis apprec.; cap RF less 2 sec. Psych:  No HI/SI, judgement and insight good, Euthymic mood. Full Affect.   BP 133/79    Pulse 69    Ht 5' 7.75" (1.721 m)    Wt 178 lb 8 oz (81 kg)    SpO2 97%    BMI 27.34 kg/m  Wt Readings from Last 3 Encounters:  09/15/20 178 lb 8 oz (81 kg)  05/10/20 179 lb 6.4 oz (81.4 kg)  01/20/20  197 lb 9.6 oz (89.6 kg)     Health Maintenance Due  Topic Date Due   OPHTHALMOLOGY EXAM  Never done   HIV Screening  Never done   PAP SMEAR-Modifier  Never done   MAMMOGRAM  Never done   COLONOSCOPY  Never done   COLON CANCER SCREENING ANNUAL FOBT  11/13/2019    There are no preventive care reminders to display for this patient.  Lab Results  Component Value Date   TSH 0.270 (L) 06/14/2020   Lab Results  Component Value Date   WBC 6.5 12/14/2019   HGB 15.3 12/14/2019   HCT 44.5 12/14/2019   MCV 91 12/14/2019   PLT 204 12/14/2019   Lab Results  Component Value Date   NA 142 06/14/2020   K 4.1 06/14/2020   CO2 24 06/14/2020   GLUCOSE 109 (H) 06/14/2020   BUN 15 06/14/2020   CREATININE 0.63 06/14/2020   BILITOT 0.4 06/14/2020   ALKPHOS 57 06/14/2020   AST 17 06/14/2020   ALT 31 06/14/2020   PROT 7.3 06/14/2020   ALBUMIN 4.6 06/14/2020   CALCIUM 9.6 06/14/2020   Lab Results  Component Value Date   CHOL 245 (H) 06/14/2020   Lab Results  Component Value Date   HDL 50 06/14/2020   Lab Results  Component Value Date   LDLCALC 166 (H) 06/14/2020   Lab Results  Component Value Date   TRIG 158 (H) 06/14/2020   Lab Results  Component Value Date   CHOLHDL 4.9 (H) 06/14/2020   Lab Results  Component Value Date   HGBA1C 5.1 09/15/2020      Assessment & Plan:   Problem List Items Addressed This Visit      Cardiovascular and Mediastinum   Hypertension associated with type 2  diabetes mellitus ([place])    - BP stable - Continue current medication regimen. - Continue ambulatory BP and pulse monitoring and keep a log. - Follow DASH diet. - Continue physical activity level.         Endocrine   Type 2 diabetes mellitus with diabetic neuropathy, unspecified ([place]) - Primary    -A1c continues to be at goal, decreased from 5.4 to 5.1 so discussed with patient discontinuing Metformin to reduce with the risk of hypoglycemic events.  Patient verbalized understanding and is agreeable. -Continue ambulatory glucose monitoring.  Discussed with patient if starts noticing fasting blood sugars increase (>120) then recommend restarting Metformin at 500 mg once daily.  Patient verbalized understanding. -Continue low carbohydrate and glucose diet. -Will request most recent eye exam. -Will continue to monitor.      Relevant Orders   POCT glycosylated hemoglobin (Hb A1C) (Completed)    Other Visit Diagnoses    Hyperlipidemia associated with type 2 diabetes mellitus ([place])         Hyperlipidemia associated with type 2 diabetes mellitus: -Last lipid panel total cholesterol, triglycerides, LDL elevated -Patient intolerant to statins and fenofibrate and discussed referral to lipid clinic for management.  Patient prefers to wait at this time until she completes full evaluation by endocrinology for hyperthyroidism. -Recommend to follow a heart healthy diet low in saturated and trans fats. -Resume physical activity level. -Plan to recheck lipid panel next OV.   No orders of the defined types were placed in this encounter.   Follow-up: Return in about 3 months (around 12/16/2020) for DM, HTN, HLD and FBW few days prior (lipid panel, cmp, a1c).   Note:  This note was prepared with assistance of  [employer] Armed forces training and education officer. Occasional wrong-word or sound-a-like substitutions may have occurred due to the inherent limitations of voice recognition software.  Lorrene Reid, PA-C

## 2020-09-21 DIAGNOSIS — E0590 Thyrotoxicosis, unspecified without thyrotoxic crisis or storm: Secondary | ICD-10-CM | POA: Diagnosis not present

## 2020-10-13 ENCOUNTER — Other Ambulatory Visit: Payer: Self-pay | Admitting: Adult Health

## 2020-10-13 DIAGNOSIS — E1140 Type 2 diabetes mellitus with diabetic neuropathy, unspecified: Secondary | ICD-10-CM

## 2020-10-21 ENCOUNTER — Encounter: Payer: Self-pay | Admitting: Physician Assistant

## 2020-10-21 DIAGNOSIS — E1140 Type 2 diabetes mellitus with diabetic neuropathy, unspecified: Secondary | ICD-10-CM

## 2020-10-24 MED ORDER — ACCU-CHEK AVIVA PLUS VI STRP: ORAL_STRIP | 12 refills | Status: DC

## 2020-11-10 ENCOUNTER — Telehealth: Payer: Self-pay | Admitting: Physician Assistant

## 2020-11-10 DIAGNOSIS — E1140 Type 2 diabetes mellitus with diabetic neuropathy, unspecified: Secondary | ICD-10-CM

## 2020-11-10 MED ORDER — ACCU-CHEK GUIDE VI STRP: ORAL_STRIP | 12 refills | Status: DC

## 2020-11-10 NOTE — Telephone Encounter (Signed)
Test strips set to pharmacy. AS, CMA

## 2020-11-27 ENCOUNTER — Other Ambulatory Visit: Payer: Self-pay | Admitting: Physician Assistant

## 2020-11-27 DIAGNOSIS — I152 Hypertension secondary to endocrine disorders: Secondary | ICD-10-CM

## 2020-11-28 MED ORDER — ENALAPRIL MALEATE 5 MG PO TABS
5.0000 mg | ORAL_TABLET | Freq: Every day | ORAL | 0 refills | Status: DC
Start: 2020-11-28 — End: 2020-12-22

## 2020-11-28 NOTE — Addendum Note (Signed)
Addended by: Stan Head on: 11/28/2020 08:49 AM   Modules accepted: Orders

## 2020-12-18 ENCOUNTER — Other Ambulatory Visit: Payer: Self-pay | Admitting: Physician Assistant

## 2020-12-18 DIAGNOSIS — E1159 Type 2 diabetes mellitus with other circulatory complications: Secondary | ICD-10-CM

## 2020-12-18 DIAGNOSIS — E1140 Type 2 diabetes mellitus with diabetic neuropathy, unspecified: Secondary | ICD-10-CM

## 2020-12-18 DIAGNOSIS — E785 Hyperlipidemia, unspecified: Secondary | ICD-10-CM

## 2020-12-18 DIAGNOSIS — E1169 Type 2 diabetes mellitus with other specified complication: Secondary | ICD-10-CM

## 2020-12-19 ENCOUNTER — Other Ambulatory Visit: Payer: Self-pay

## 2020-12-19 ENCOUNTER — Other Ambulatory Visit: Payer: Federal, State, Local not specified - PPO

## 2020-12-19 DIAGNOSIS — E1159 Type 2 diabetes mellitus with other circulatory complications: Secondary | ICD-10-CM

## 2020-12-19 DIAGNOSIS — E785 Hyperlipidemia, unspecified: Secondary | ICD-10-CM

## 2020-12-19 DIAGNOSIS — I152 Hypertension secondary to endocrine disorders: Secondary | ICD-10-CM

## 2020-12-19 DIAGNOSIS — E1169 Type 2 diabetes mellitus with other specified complication: Secondary | ICD-10-CM | POA: Diagnosis not present

## 2020-12-19 DIAGNOSIS — E1140 Type 2 diabetes mellitus with diabetic neuropathy, unspecified: Secondary | ICD-10-CM

## 2020-12-20 LAB — LIPID PANEL
Chol/HDL Ratio: 5.4 ratio — ABNORMAL HIGH (ref 0.0–4.4)
Cholesterol, Total: 265 mg/dL — ABNORMAL HIGH (ref 100–199)
HDL: 49 mg/dL
LDL Chol Calc (NIH): 201 mg/dL — ABNORMAL HIGH (ref 0–99)
Triglycerides: 90 mg/dL (ref 0–149)
VLDL Cholesterol Cal: 15 mg/dL (ref 5–40)

## 2020-12-20 LAB — COMPREHENSIVE METABOLIC PANEL
ALT: 26 IU/L (ref 0–32)
AST: 20 IU/L (ref 0–40)
Albumin/Globulin Ratio: 1.9 (ref 1.2–2.2)
Albumin: 4.7 g/dL (ref 3.8–4.8)
Alkaline Phosphatase: 68 IU/L (ref 44–121)
BUN/Creatinine Ratio: 18 (ref 12–28)
BUN: 14 mg/dL (ref 8–27)
Bilirubin Total: 0.4 mg/dL (ref 0.0–1.2)
CO2: 26 mmol/L (ref 20–29)
Calcium: 9.6 mg/dL (ref 8.7–10.3)
Chloride: 105 mmol/L (ref 96–106)
Creatinine, Ser: 0.8 mg/dL (ref 0.57–1.00)
GFR calc Af Amer: 90 mL/min/{1.73_m2} (ref >59)
GFR calc non Af Amer: 78 mL/min/{1.73_m2} (ref >59)
Globulin, Total: 2.5 g/dL (ref 1.5–4.5)
Glucose: 116 mg/dL — ABNORMAL HIGH (ref 65–99)
Potassium: 3.9 mmol/L (ref 3.5–5.2)
Sodium: 146 mmol/L — ABNORMAL HIGH (ref 134–144)
Total Protein: 7.2 g/dL (ref 6.0–8.5)

## 2020-12-20 LAB — HEMOGLOBIN A1C
Est. average glucose Bld gHb Est-mCnc: 114 mg/dL
Hgb A1c MFr Bld: 5.6 % (ref 4.8–5.6)

## 2020-12-22 ENCOUNTER — Encounter: Payer: Self-pay | Admitting: Physician Assistant

## 2020-12-22 ENCOUNTER — Telehealth (INDEPENDENT_AMBULATORY_CARE_PROVIDER_SITE_OTHER): Payer: Federal, State, Local not specified - PPO | Admitting: Physician Assistant

## 2020-12-22 VITALS — BP 110/77 | HR 69 | Ht 68.0 in | Wt 180.0 lb

## 2020-12-22 DIAGNOSIS — M79641 Pain in right hand: Secondary | ICD-10-CM

## 2020-12-22 DIAGNOSIS — E1169 Type 2 diabetes mellitus with other specified complication: Secondary | ICD-10-CM

## 2020-12-22 DIAGNOSIS — E1159 Type 2 diabetes mellitus with other circulatory complications: Secondary | ICD-10-CM | POA: Diagnosis not present

## 2020-12-22 DIAGNOSIS — E1140 Type 2 diabetes mellitus with diabetic neuropathy, unspecified: Secondary | ICD-10-CM

## 2020-12-22 DIAGNOSIS — E0590 Thyrotoxicosis, unspecified without thyrotoxic crisis or storm: Secondary | ICD-10-CM | POA: Diagnosis not present

## 2020-12-22 DIAGNOSIS — E785 Hyperlipidemia, unspecified: Secondary | ICD-10-CM

## 2020-12-22 DIAGNOSIS — I152 Hypertension secondary to endocrine disorders: Secondary | ICD-10-CM

## 2020-12-22 DIAGNOSIS — M79642 Pain in left hand: Secondary | ICD-10-CM

## 2020-12-22 MED ORDER — ENALAPRIL MALEATE 5 MG PO TABS: 5.0000 mg | ORAL_TABLET | Freq: Every day | ORAL | 3 refills | Status: DC

## 2020-12-22 MED ORDER — HYDROCHLOROTHIAZIDE 12.5 MG PO TABS: 12.5000 mg | ORAL_TABLET | Freq: Every day | ORAL | 3 refills | Status: DC

## 2020-12-22 NOTE — Progress Notes (Signed)
Telehealth office visit note for Susan Reid, PA-C- at Primary Care at [facility]   I connected with current patient today by video enabled telemedicine application and verified that I am speaking with the correct person   . Location of the patient: Home . Location of the provider: Office - This visit type was conducted due to national recommendations for restrictions regarding the COVID-19 Pandemic (e.g. social distancing) in an effort to limit this patient's exposure and mitigate transmission in our community.    - No physical exam could be performed with this format, beyond that communicated to [place] by the patient/ family members as noted.   - Additionally my office staff/ schedulers were to discuss with the patient that there may be a monetary charge related to this service, depending on their medical insurance.  My understanding is that patient understood and consented to proceed.     _________________________________________________________________________________   History of Present Illness: Patient calls in to follow up on diabetes mellitus, hyperlipidemia and discuss most recent labs.  Diabetes mellitus: Pt denies increased urination or thirst. Pt is managing with diet. No hypoglycemic events. Checking glucose at home. FBS range 100-114. Reports continues to monitor carbohydrates and glucose. Continues to stay as active as possible by walking the dogs when the weather is not too cold. States had her annual eye exam 05/2020 with Dr. Marvel Plan in [place].   HLD: Pt stopped fenofibrate because she developed petechia and would like to give medication another try. Continues to watch saturated and trans fats.   HTN: Pt denies new onset chest pain, palpitations, dizziness or lowe extremity swelling. Taking medication as directed without side effects. Patient has not been drinking as much water as used to.   Reports is experiencing arthritic pain of both hands. Has noticed  some swelling and stiffness. Denies redness. Has not tried anything for it yet.     No flowsheet data found.  Depression screen [facility] 2/9 12/22/2020 09/15/2020 05/10/2020 01/20/2020 12/09/2019  Decreased Interest 0 0 0 0 0  Down, Depressed, Hopeless 0 0 1 0 0  PHQ - 2 Score 0 0 1 0 0  Altered sleeping 0 _0 Tired, decreased energy 0 1 0 0 1  Change in appetite 0 0 0 0 1  Feeling bad or failure about yourself  0 0 0 0 0  Trouble concentrating 0 0 0 0 0  Moving slowly or fidgety/restless 0 0 0 0 0  Suicidal thoughts 0 0 0 0 0  PHQ-9 Score 0 _1 Difficult doing work/chores - Somewhat difficult Not difficult at all Somewhat difficult Not difficult at all      Impression and Recommendations:     1. Type 2 diabetes mellitus with diabetic neuropathy, without long-term current use of insulin ([place])   2. Hypertension associated with type 2 diabetes mellitus ([place])   3. Hyperlipidemia associated with type 2 diabetes mellitus ([place])   4. Hyperthyroidism- subclinical    5. Pain in both hands     Type 2 diabetes mellitus with diabetic neuropathy, without long-term current use of insulin: -Most recent A1c 5.6, controlled with diet. -Continue ambulatory glucose monitoring and low carbohydrate and glucose diet.  Continue to stay as active as possible. -Will continue to monitor.  Hypertension associated with type 2 diabetes mellitus: -Controlled. -Continue current medication regimen.  Provided refills. -Recommend to increase hydration and follow low-sodium diet. -Recent CMP: Renal function normal, sodium mildly  elevated at 146 no other electrolyte imbalances -Will continue to monitor.  Hyperlipidemia associated with type 2 diabetes mellitus: -Most recent lipid panel: Total cholesterol 265, to glycerides 90, HDL 49, LDL 201 -Patient has not tolerated statins in the past and it is reasonable to try fenofibrate again.  Patient will resume and will let me know if tolerates  medication. -Continue heart healthy diet. -Will continue to monitor.  Hyperthyroidism-subclinical: -Followed by endocrinology. -On methimazole 5 mg.  Pain in both hands: -Discussed with patient various etiologies and will further evaluate at follow-up visit.  Recommend to try topical anti-inflammatory such as Voltaren gel for pain relief.   - As part of my medical decision making, I reviewed the following data within the [place] History obtained from pt /family, CMA notes reviewed and incorporated if applicable, Labs reviewed, Radiograph/ tests reviewed if applicable and OV notes from prior OV's with me, as well as any other specialists she/he has seen since seeing me last, were all reviewed and used in my medical decision making process today.    - Additionally, when appropriate, discussion had with patient regarding our treatment plan, and their biases/concerns about that plan were used in my medical decision making today.    - The patient agreed with the plan and demonstrated an understanding of the instructions.   No barriers to understanding were identified.     - The patient was advised to call back or seek an in-person evaluation if the symptoms worsen or if the condition fails to improve as anticipated.   Return in about 3 months (around 03/22/2021) for DM, HLD, sleep .    No orders of the defined types were placed in this encounter.   Meds ordered this encounter  Medications  . enalapril (VASOTEC) 5 MG tablet    Sig: Take 1 tablet (5 mg total) by mouth daily.    Dispense:  90 tablet    Refill:  3    Order Specific Question:   Supervising Provider    Answer:   Beatrice Lecher D [[id number]]  . hydrochlorothiazide (HYDRODIURIL) 12.5 MG tablet    Sig: Take 1 tablet (12.5 mg total) by mouth daily.    Dispense:  90 tablet    Refill:  3    Order Specific Question:   Supervising Provider    Answer:   Beatrice Lecher D [[id number]]    Medications  Discontinued During This Encounter  Medication Reason  . hydrochlorothiazide (HYDRODIURIL) 12.5 MG tablet Reorder  . enalapril (VASOTEC) 5 MG tablet Reorder       Time spent on visit including pre-visit chart review and post-visit care was 15 minutes.      The [place] was signed into law in 2016 which includes the topic of electronic health records.  This provides immediate access to information in MyChart.  This includes consultation notes, operative notes, office notes, lab results and pathology reports.  If you have any questions about what you read please let us know at your next visit or call us at the office.  We are right here with you.  Note:  This note was prepared with assistance of Dragon voice recognition software. Occasional wrong-word or sound-a-like substitutions may have occurred due to the inherent limitations of voice recognition software.  __________________________________________________________________________________     Patient Care Team    Relationship Specialty Notifications Start End  Susan Sandoval, [place] PCP - General Physician Assistant  05/10/20      -Vitals obtained;  medications/ allergies reconciled;  personal medical, social, Sx etc.histories were updated by CMA, reviewed by me and are reflected in chart   Patient Active Problem List   Diagnosis Date Noted  . Abnormal TSH 06/16/2020  . Psoriasis 05/21/2020  . Hypertension associated with type 2 diabetes mellitus ([place]) 12/10/2019  . GAD (generalized anxiety disorder) 12/10/2019  . Type 2 diabetes mellitus with diabetic neuropathy, unspecified ([place]) 12/10/2019  . Healthcare maintenance 12/09/2019  . Uncontrolled diabetes mellitus ([place]) 12/09/2019     Current Meds  Medication Sig  . Accu-Chek Softclix Lancets lancets Use to check blood sugars every morning fasting and 2 hours after largest meal  . clobetasol (TEMOVATE) 0.05 % external solution APPLY 1 APPLICATION         TOPICALLY TWO TIMES A DAY  . cyclobenzaprine (FLEXERIL) 10 MG tablet Take 1 tablet (10 mg total) by mouth 3 (three) times daily as needed for muscle spasms.  Marland Kitchen glucose blood (ACCU-CHEK GUIDE) test strip Use as instructed  . Lancets Misc. (ACCU-CHEK SOFTCLIX LANCET DEV) KIT Use to check blood sugars every morning fasting and 2 hours after largest meal  . [DISCONTINUED] enalapril (VASOTEC) 5 MG tablet Take 1 tablet (5 mg total) by mouth daily.  . [DISCONTINUED] hydrochlorothiazide (HYDRODIURIL) 12.5 MG tablet TAKE 1 TABLET DAILY     Allergies:  Allergies  Allergen Reactions  . Statins Palpitations    "crawling in legs"     ROS:  See above HPI for pertinent positives and negatives   Objective:   Blood pressure 110/77, pulse 69, height _0  (1.727 m), weight 180 lb (81.6 kg).  (if some vitals are omitted, this means that patient was UNABLE to obtain them. ) General: A & O * 3; sounds in no acute distress; in usual state of health.  HEENT:  Lips non-cyanotic Chest: Normal chest excursion and movement Respiratory: speaking in full sentences, no conversational dyspnea;no use of accessory muscles Psych: insight good, mood- normal

## 2021-01-10 DIAGNOSIS — E0500 Thyrotoxicosis with diffuse goiter without thyrotoxic crisis or storm: Secondary | ICD-10-CM | POA: Diagnosis not present

## 2021-01-10 DIAGNOSIS — Z6827 Body mass index (BMI) 27.0-27.9, adult: Secondary | ICD-10-CM | POA: Diagnosis not present

## 2021-02-20 ENCOUNTER — Other Ambulatory Visit: Payer: Self-pay | Admitting: Physician Assistant

## 2021-02-20 DIAGNOSIS — E7800 Pure hypercholesterolemia, unspecified: Secondary | ICD-10-CM

## 2021-03-15 ENCOUNTER — Telehealth: Payer: Self-pay | Admitting: Physician Assistant

## 2021-03-15 DIAGNOSIS — I152 Hypertension secondary to endocrine disorders: Secondary | ICD-10-CM

## 2021-03-15 DIAGNOSIS — E1140 Type 2 diabetes mellitus with diabetic neuropathy, unspecified: Secondary | ICD-10-CM

## 2021-03-15 DIAGNOSIS — E785 Hyperlipidemia, unspecified: Secondary | ICD-10-CM

## 2021-03-15 DIAGNOSIS — E1169 Type 2 diabetes mellitus with other specified complication: Secondary | ICD-10-CM

## 2021-03-15 NOTE — Telephone Encounter (Signed)
Patient would like labs for cholesterol and her A1C. There are no new lab orders. Please advise, thanks.

## 2021-03-15 NOTE — Telephone Encounter (Signed)
Please review and advise. AS, CMA 

## 2021-03-16 NOTE — Addendum Note (Signed)
Addended by: Sylvester Harder on: 03/16/2021 01:32 PM   Modules accepted: Orders

## 2021-03-16 NOTE — Telephone Encounter (Signed)
Patient scheduled for labs

## 2021-03-16 NOTE — Telephone Encounter (Signed)
Please contact patient to schedule lab apt per [facility]. AS, CMA

## 2021-03-16 NOTE — Telephone Encounter (Signed)
Ok to place lab orders for CMP, A1c, and lipid panel.   Thank you, Kandis Cocking

## 2021-03-17 ENCOUNTER — Other Ambulatory Visit: Payer: Federal, State, Local not specified - PPO

## 2021-03-21 ENCOUNTER — Other Ambulatory Visit: Payer: Federal, State, Local not specified - PPO

## 2021-03-21 ENCOUNTER — Other Ambulatory Visit: Payer: Self-pay

## 2021-03-21 DIAGNOSIS — E1169 Type 2 diabetes mellitus with other specified complication: Secondary | ICD-10-CM

## 2021-03-21 DIAGNOSIS — I152 Hypertension secondary to endocrine disorders: Secondary | ICD-10-CM | POA: Diagnosis not present

## 2021-03-21 DIAGNOSIS — E1159 Type 2 diabetes mellitus with other circulatory complications: Secondary | ICD-10-CM | POA: Diagnosis not present

## 2021-03-21 DIAGNOSIS — E1140 Type 2 diabetes mellitus with diabetic neuropathy, unspecified: Secondary | ICD-10-CM | POA: Diagnosis not present

## 2021-03-22 LAB — LIPID PANEL
Chol/HDL Ratio: 4.5 ratio — ABNORMAL HIGH (ref 0.0–4.4)
Cholesterol, Total: 213 mg/dL — ABNORMAL HIGH (ref 100–199)
HDL: 47 mg/dL (ref >39)
LDL Chol Calc (NIH): 153 mg/dL — ABNORMAL HIGH (ref 0–99)
Triglycerides: 73 mg/dL (ref 0–149)
VLDL Cholesterol Cal: 13 mg/dL (ref 5–40)

## 2021-03-22 LAB — HEMOGLOBIN A1C
Est. average glucose Bld gHb Est-mCnc: 117 mg/dL
Hgb A1c MFr Bld: 5.7 % — ABNORMAL HIGH (ref 4.8–5.6)

## 2021-03-22 LAB — COMPREHENSIVE METABOLIC PANEL
ALT: 18 IU/L (ref 0–32)
AST: 16 IU/L (ref 0–40)
Albumin/Globulin Ratio: 1.9 (ref 1.2–2.2)
Albumin: 4.2 g/dL (ref 3.8–4.8)
Alkaline Phosphatase: 53 IU/L (ref 44–121)
BUN/Creatinine Ratio: 25 (ref 12–28)
BUN: 20 mg/dL (ref 8–27)
Bilirubin Total: <0.2 mg/dL (ref 0.0–1.2)
CO2: 23 mmol/L (ref 20–29)
Calcium: 9.2 mg/dL (ref 8.7–10.3)
Chloride: 107 mmol/L — ABNORMAL HIGH (ref 96–106)
Creatinine, Ser: 0.8 mg/dL (ref 0.57–1.00)
Globulin, Total: 2.2 g/dL (ref 1.5–4.5)
Glucose: 107 mg/dL — ABNORMAL HIGH (ref 65–99)
Potassium: 3.7 mmol/L (ref 3.5–5.2)
Sodium: 145 mmol/L — ABNORMAL HIGH (ref 134–144)
Total Protein: 6.4 g/dL (ref 6.0–8.5)
eGFR: 82 mL/min/{1.73_m2} (ref >59)

## 2021-03-27 ENCOUNTER — Ambulatory Visit (INDEPENDENT_AMBULATORY_CARE_PROVIDER_SITE_OTHER): Payer: Federal, State, Local not specified - PPO | Admitting: Physician Assistant

## 2021-03-27 ENCOUNTER — Encounter: Payer: Self-pay | Admitting: Physician Assistant

## 2021-03-27 ENCOUNTER — Other Ambulatory Visit: Payer: Self-pay

## 2021-03-27 VITALS — BP 132/83 | HR 72 | Temp 97.8°F | Ht 68.0 in | Wt 187.7 lb

## 2021-03-27 DIAGNOSIS — E1169 Type 2 diabetes mellitus with other specified complication: Secondary | ICD-10-CM | POA: Diagnosis not present

## 2021-03-27 DIAGNOSIS — E785 Hyperlipidemia, unspecified: Secondary | ICD-10-CM | POA: Insufficient documentation

## 2021-03-27 DIAGNOSIS — E1159 Type 2 diabetes mellitus with other circulatory complications: Secondary | ICD-10-CM

## 2021-03-27 DIAGNOSIS — M79642 Pain in left hand: Secondary | ICD-10-CM

## 2021-03-27 DIAGNOSIS — G4700 Insomnia, unspecified: Secondary | ICD-10-CM

## 2021-03-27 DIAGNOSIS — E1140 Type 2 diabetes mellitus with diabetic neuropathy, unspecified: Secondary | ICD-10-CM

## 2021-03-27 DIAGNOSIS — I152 Hypertension secondary to endocrine disorders: Secondary | ICD-10-CM

## 2021-03-27 DIAGNOSIS — M79641 Pain in right hand: Secondary | ICD-10-CM

## 2021-03-27 MED ORDER — TRAZODONE HCL 50 MG PO TABS: 25.0000 mg | ORAL_TABLET | Freq: Every evening | ORAL | 0 refills | Status: DC | PRN

## 2021-03-27 MED ORDER — METFORMIN HCL 500 MG PO TABS: 500.0000 mg | ORAL_TABLET | Freq: Every day | ORAL | 0 refills | Status: DC

## 2021-03-27 NOTE — Assessment & Plan Note (Signed)
-  Controlled. -Continue current medication regimen. Continue ambulatory BP monitoring. -Recent CMP- renal function normal, sodium mildly elevated. Recommend to continue to stay well hydrated and monitor sodium intake. -Will continue to monitor.

## 2021-03-27 NOTE — Assessment & Plan Note (Signed)
-  Recent lipid panel: Total cholesterol 213, triglycerides 73, HDL 47, LDL 153 (has improved from 201). -Patient has not been able to tolerate statins in the past, tolerating fenofibrate so we will continue medication. -Recommend to follow a heart healthy diet and increase physical activity. -Will continue to monitor and repeat lipid panel at follow-up visit.

## 2021-03-27 NOTE — Progress Notes (Signed)
Established Patient Office Visit  Subjective:  Patient ID: Susan Sandoval, female    DOB: 12/20/1955  Age: 65 y.o. MRN: [id number]  CC:  Chief Complaint  Patient presents with  . Diabetes  . Hyperlipidemia  . Insomnia    HPI [name] presents for follow up on diabetes mellitus, hyperlipidemia and insomnia.  Patient has complaints of left hand pain mainly below her thumb near her wrist, especially when knitting or crocheting.  Also reports right hand stiffness and swelling usually in the mornings which improves throughout the day. Has tried Voltaren gel with minimal relief.  No family history of rheumatoid arthritis or autoimmune condition.  Diabetes: Pt denies increased urination or thirst. Pt reports recently restarted Metformin 500 mg once daily due to noticing her FBS were running in the 120s.  Since starting medication has noticed her sugars decreasing to 107-18. Denies diet changes but has not been as diligent with monitoring carbohydrates and glucose. Also reports her husband had a hand injury with wood working machinery which created some increased stress during that time.  Also has not been as active but plans to get back on track. No hypoglycemic events.   HLD: Pt taking medication as directed without issues. Denies petechia or palpitations. Plans to resume physical activity level.  Insomnia: Patient reports has trouble with staying sleep.  Has tried melatonin which was ineffective.  Tries to limit caffeine intake. Has 1 cup of coffee in the mornings.  HTN: Pt denies chest pain, palpitations, dizziness or increased lower extremity swelling. Taking medication as directed without side effects. Checks BP at home and readings range 117/70s.  Pt tries to monitor sodium intake. Reports good hydration.   Past Medical History:  Diagnosis Date  . Diabetes mellitus without complication ([place])   . Hyperlipidemia   . Hypertension     Past Surgical History:  Procedure Laterality  Date  . CESAREAN SECTION    . CHOLECYSTECTOMY    . EYE SURGERY     childhood  . SPINE SURGERY     discectomy/ laminectomy L4-L5  . WRIST SURGERY Right     Family History  Problem Relation Age of Onset  . Hypertension Mother   . Alzheimer's disease Mother   . Diabetes Father   . Hypertension Father   . Stroke Father   . Diabetes Sister   . Hypertension Sister   . Hypertension Maternal Grandmother   . Cancer Maternal Grandmother        intestinal   . Alcohol abuse Maternal Grandfather   . Stroke Maternal Grandfather   . Stroke Paternal Grandmother   . Hypertension Paternal Grandfather   . Stroke Paternal Grandfather     Social History   Socioeconomic History  . Marital status: Married    Spouse name: Not on file  . Number of children: Not on file  . Years of education: Not on file  . Highest education level: Not on file  Occupational History  . Not on file  Tobacco Use  . Smoking status: Never Smoker  . Smokeless tobacco: Never Used  Vaping Use  . Vaping Use: Never used  Substance and Sexual Activity  . Alcohol use: Yes    Alcohol/week: 2.0 standard drinks    Types: 2 Glasses of wine per week  . Drug use: Never  . Sexual activity: Yes  Other Topics Concern  . Not on file  Social History Narrative  . Not on file   Social Determinants of Health  Financial Resource Strain: Not on file  Food Insecurity: Not on file  Transportation Needs: Not on file  Physical Activity: Not on file  Stress: Not on file  Social Connections: Not on file  Intimate Partner Violence: Not on file    Outpatient Medications Prior to Visit  Medication Sig Dispense Refill  . Accu-Chek Softclix Lancets lancets Use to check blood sugars every morning fasting and 2 hours after largest meal 100 each 12  . calcium carbonate (OS-CAL) 1250 (500 Ca) MG chewable tablet Chew 1 tablet by mouth daily.    . clobetasol (TEMOVATE) 0.05 % external solution APPLY 1 APPLICATION        TOPICALLY  TWO TIMES A DAY 50 mL 0  . cyclobenzaprine (FLEXERIL) 10 MG tablet Take 1 tablet (10 mg total) by mouth 3 (three) times daily as needed for muscle spasms. 60 tablet 0  . enalapril (VASOTEC) 5 MG tablet Take 1 tablet (5 mg total) by mouth daily. 90 tablet 3  . Fenofibrate 50 MG CAPS TAKE 1 CAPSULE DAILY 90 capsule 0  . glucose blood (ACCU-CHEK GUIDE) test strip Use as instructed 100 each 12  . hydrochlorothiazide (HYDRODIURIL) 12.5 MG tablet Take 1 tablet (12.5 mg total) by mouth daily. 90 tablet 3  . Lancets Misc. (ACCU-CHEK SOFTCLIX LANCET DEV) KIT Use to check blood sugars every morning fasting and 2 hours after largest meal 1 kit 0  . methimazole (TAPAZOLE) 5 MG tablet Take 5 mg by mouth daily.     No facility-administered medications prior to visit.    Allergies  Allergen Reactions  . Statins Palpitations    "crawling in legs"    ROS Review of Systems Review of Systems:  A fourteen system review of systems was performed and found to be positive as per HPI.    Objective:    Physical Exam General:  Well Developed, well nourished, no acute distress Neuro:  Alert and oriented,  extra-ocular muscles intact  HEENT:  Normocephalic, atraumatic, neck supple Skin:  no gross rash, warm, pink. Cardiac:  RRR, S1 S2 Respiratory:  ECTA B/L without wheezing, Not using accessory muscles, speaking in full sentences- unlabored. MSK: TTP of [facility] joint of left hand, good ROM and grip strength of both hands, DIP and PIP thickening noted of both hands Vascular:  Ext warm, no cyanosis apprec.; cap RF less 2 sec. no edema Psych:  No HI/SI, judgement and insight good, Euthymic mood. Full Affect.   BP 132/83   Pulse 72   Temp 97.8 F (36.6 C)   Ht _0  (1.727 m)   Wt 187 lb 11.2 oz (85.1 kg)   SpO2 98%   BMI 28.54 kg/m  Wt Readings from Last 3 Encounters:  03/27/21 187 lb 11.2 oz (85.1 kg)  12/22/20 180 lb (81.6 kg)  09/15/20 178 lb 8 oz (81 kg)     Health Maintenance Due  Topic Date  Due  . OPHTHALMOLOGY EXAM  Never done  . HIV Screening  Never done  . PAP SMEAR-Modifier  Never done  . COLONOSCOPY (Pts 45-73yr Insurance coverage will need to be confirmed)  Never done  . MAMMOGRAM  Never done  . COLON CANCER SCREENING ANNUAL FOBT  11/13/2019  . FOOT EXAM  01/19/2021    There are no preventive care reminders to display for this patient.  Lab Results  Component Value Date   TSH 0.270 (L) 06/14/2020   Lab Results  Component Value Date   WBC 6.5 12/14/2019  HGB 15.3 12/14/2019   HCT 44.5 12/14/2019   MCV 91 12/14/2019   PLT 204 12/14/2019   Lab Results  Component Value Date   NA 145 (H) 03/21/2021   K 3.7 03/21/2021   CO2 23 03/21/2021   GLUCOSE 107 (H) 03/21/2021   BUN 20 03/21/2021   CREATININE 0.80 03/21/2021   BILITOT <0.2 03/21/2021   ALKPHOS 53 03/21/2021   AST 16 03/21/2021   ALT 18 03/21/2021   PROT 6.4 03/21/2021   ALBUMIN 4.2 03/21/2021   CALCIUM 9.2 03/21/2021   Lab Results  Component Value Date   CHOL 213 (H) 03/21/2021   Lab Results  Component Value Date   HDL 47 03/21/2021   Lab Results  Component Value Date   LDLCALC 153 (H) 03/21/2021   Lab Results  Component Value Date   TRIG 73 03/21/2021   Lab Results  Component Value Date   CHOLHDL 4.5 (H) 03/21/2021   Lab Results  Component Value Date   HGBA1C 5.7 (H) 03/21/2021      Assessment & Plan:   Problem List Items Addressed This Visit      Cardiovascular and Mediastinum   Hypertension associated with type 2 diabetes mellitus ([place])    -Controlled. -Continue current medication regimen. Continue ambulatory BP monitoring. -Recent CMP- renal function normal, sodium mildly elevated. Recommend to continue to stay well hydrated and monitor sodium intake. -Will continue to monitor.      Relevant Medications   metFORMIN (GLUCOPHAGE) 500 MG tablet     Endocrine   Type 2 diabetes mellitus with diabetic neuropathy, unspecified ([place]) - Primary    -A1c has mildly  increased from 5.6 to 5.7, recommend to continue with Metformin 500 mg once daily. Will repeat A1c at follow up visit and consider treatment adjustments if A1c fails to improve. -Continue ambulatory glucose monitoring. Recommend to monitor carbohydrates and glucose. Increase physical activity as intended. -Will continue to monitor.      Relevant Medications   metFORMIN (GLUCOPHAGE) 500 MG tablet   Hyperlipidemia associated with type 2 diabetes mellitus ([place])    -Recent lipid panel: Total cholesterol 213, triglycerides 73, HDL 47, LDL 153 (has improved from 201). -Patient has not been able to tolerate statins in the past, tolerating fenofibrate so we will continue medication. -Recommend to follow a heart healthy diet and increase physical activity. -Will continue to monitor and repeat lipid panel at follow-up visit.      Relevant Medications   metFORMIN (GLUCOPHAGE) 500 MG tablet    Other Visit Diagnoses    Insomnia, unspecified type       Relevant Medications   traZODone (DESYREL) 50 MG tablet   Pain in both hands       Relevant Orders   DG Hand Complete Left   DG Hand Complete Right     Insomnia, unspecified type: -Discussed good sleep hygiene and alternative management options including medications and potential side effects.  Patient wants to trial trazodone.  Advised to let me know if unable to tolerate medication. -Will continue to monitor.  Pain in both hands: -Discussed with patient potential etiologies and will place imaging order for x-rays of both hands.  Recommend to continue topical anti-inflammatory as needed or heat therapy. -Follow-up if symptoms worsen or fail to improve.  Meds ordered this encounter  Medications  . traZODone (DESYREL) 50 MG tablet    Sig: Take 0.5-1 tablets (25-50 mg total) by mouth at bedtime as needed for sleep.    Dispense:  30 tablet    Refill:  0  . metFORMIN (GLUCOPHAGE) 500 MG tablet    Sig: Take 1 tablet (500 mg total) by mouth daily  with breakfast.    Dispense:  90 tablet    Refill:  0    Follow-up: Return in about 4 months (around 07/27/2021) for DM, HLD, Insomnia.   Note:  This note was prepared with assistance of Dragon voice recognition software. Occasional wrong-word or sound-a-like substitutions may have occurred due to the inherent limitations of voice recognition software.  Lorrene Reid, PA-C

## 2021-03-27 NOTE — Assessment & Plan Note (Signed)
-  A1c has mildly increased from 5.6 to 5.7, recommend to continue with Metformin 500 mg once daily. Will repeat A1c at follow up visit and consider treatment adjustments if A1c fails to improve. -Continue ambulatory glucose monitoring. Recommend to monitor carbohydrates and glucose. Increase physical activity as intended. -Will continue to monitor.

## 2021-03-27 NOTE — Patient Instructions (Signed)
Diabetes Mellitus and Nutrition, Adult When you have diabetes, or diabetes mellitus, it is very important to have healthy eating habits because your blood sugar (glucose) levels are greatly affected by what you eat and drink. Eating healthy foods in the right amounts, at about the same times every day, can help you:  Control your blood glucose.  Lower your risk of heart disease.  Improve your blood pressure.  Reach or maintain a healthy weight. What can affect my meal plan? Every person with diabetes is different, and each person has different needs for a meal plan. Your health care provider may recommend that you work with a dietitian to make a meal plan that is best for you. Your meal plan may vary depending on factors such as:  The calories you need.  The medicines you take.  Your weight.  Your blood glucose, blood pressure, and cholesterol levels.  Your activity level.  Other health conditions you have, such as heart or kidney disease. How do carbohydrates affect me? Carbohydrates, also called carbs, affect your blood glucose level more than any other type of food. Eating carbs naturally raises the amount of glucose in your blood. Carb counting is a method for keeping track of how many carbs you eat. Counting carbs is important to keep your blood glucose at a healthy level, especially if you use insulin or take certain oral diabetes medicines. It is important to know how many carbs you can safely have in each meal. This is different for every person. Your dietitian can help you calculate how many carbs you should have at each meal and for each snack. How does alcohol affect me? Alcohol can cause a sudden decrease in blood glucose (hypoglycemia), especially if you use insulin or take certain oral diabetes medicines. Hypoglycemia can be a life-threatening condition. Symptoms of hypoglycemia, such as sleepiness, dizziness, and confusion, are similar to symptoms of having too much  alcohol.  Do not drink alcohol if: ? Your health care provider tells you not to drink. ? You are pregnant, may be pregnant, or are planning to become pregnant.  If you drink alcohol: ? Do not drink on an empty stomach. ? Limit how much you use to:  0-1 drink a day for women.  0-2 drinks a day for men. ? Be aware of how much alcohol is in your drink. In the [place] one drink equals one 12 oz bottle of beer (355 mL), one 5 oz glass of wine (148 mL), or one 1 oz glass of hard liquor (44 mL). ? Keep yourself hydrated with water, diet soda, or unsweetened iced tea.  Keep in mind that regular soda, juice, and other mixers may contain a lot of sugar and must be counted as carbs. What are tips for following this plan? Reading food labels  Start by checking the serving size on the "Nutrition Facts" label of packaged foods and drinks. The amount of calories, carbs, fats, and other nutrients listed on the label is based on one serving of the item. Many items contain more than one serving per package.  Check the total grams (g) of carbs in one serving. You can calculate the number of servings of carbs in one serving by dividing the total carbs by 15. For example, if a food has 30 g of total carbs per serving, it would be equal to 2 servings of carbs.  Check the number of grams (g) of saturated fats and trans fats in one serving. Choose foods that have   a low amount or none of these fats.  Check the number of milligrams (mg) of salt (sodium) in one serving. Most people should limit total sodium intake to less than 2,300 mg per day.  Always check the nutrition information of foods labeled as "low-fat" or "nonfat." These foods may be higher in added sugar or refined carbs and should be avoided.  Talk to your dietitian to identify your daily goals for nutrients listed on the label. Shopping  Avoid buying canned, pre-made, or processed foods. These foods tend to be high in fat, sodium, and added  sugar.  Shop around the outside edge of the grocery store. This is where you will most often find fresh fruits and vegetables, bulk grains, fresh meats, and fresh dairy. Cooking  Use low-heat cooking methods, such as baking, instead of high-heat cooking methods like deep frying.  Cook using healthy oils, such as olive, canola, or sunflower oil.  Avoid cooking with butter, cream, or high-fat meats. Meal planning  Eat meals and snacks regularly, preferably at the same times every day. Avoid going long periods of time without eating.  Eat foods that are high in fiber, such as fresh fruits, vegetables, beans, and whole grains. Talk with your dietitian about how many servings of carbs you can eat at each meal.  Eat 4-6 oz (112-168 g) of lean protein each day, such as lean meat, chicken, fish, eggs, or tofu. One ounce (oz) of lean protein is equal to: ? 1 oz (28 g) of meat, chicken, or fish. ? 1 egg. ?  cup (62 g) of tofu.  Eat some foods each day that contain healthy fats, such as avocado, nuts, seeds, and fish.   What foods should I eat? Fruits Berries. Apples. Oranges. Peaches. Apricots. Plums. Grapes. Mango. Papaya. Pomegranate. Kiwi. Cherries. Vegetables Lettuce. Spinach. Leafy greens, including kale, chard, collard greens, and mustard greens. Beets. Cauliflower. Cabbage. Broccoli. Carrots. Green beans. Tomatoes. Peppers. Onions. Cucumbers. Brussels sprouts. Grains Whole grains, such as whole-wheat or whole-grain bread, crackers, tortillas, cereal, and pasta. Unsweetened oatmeal. Quinoa. Brown or wild rice. Meats and other proteins Seafood. Poultry without skin. Lean cuts of poultry and beef. Tofu. Nuts. Seeds. Dairy Low-fat or fat-free dairy products such as milk, yogurt, and cheese. The items listed above may not be a complete list of foods and beverages you can eat. Contact a dietitian for more information. What foods should I avoid? Fruits Fruits canned with  syrup. Vegetables Canned vegetables. Frozen vegetables with butter or cream sauce. Grains Refined white flour and flour products such as bread, pasta, snack foods, and cereals. Avoid all processed foods. Meats and other proteins Fatty cuts of meat. Poultry with skin. Breaded or fried meats. Processed meat. Avoid saturated fats. Dairy Full-fat yogurt, cheese, or milk. Beverages Sweetened drinks, such as soda or iced tea. The items listed above may not be a complete list of foods and beverages you should avoid. Contact a dietitian for more information. Questions to ask a health care provider  Do I need to meet with a diabetes educator?  Do I need to meet with a dietitian?  What number can I call if I have questions?  When are the best times to check my blood glucose? Where to find more information:  American Diabetes Association: diabetes.org  Academy of Nutrition and Dietetics: www.eatright.org  National Institute of Diabetes and Digestive and Kidney Diseases: www.niddk.nih.gov  Association of Diabetes Care and Education Specialists: www.diabeteseducator.org Summary  It is important to have healthy eating   habits because your blood sugar (glucose) levels are greatly affected by what you eat and drink.  A healthy meal plan will help you control your blood glucose and maintain a healthy lifestyle.  Your health care provider may recommend that you work with a dietitian to make a meal plan that is best for you.  Keep in mind that carbohydrates (carbs) and alcohol have immediate effects on your blood glucose levels. It is important to count carbs and to use alcohol carefully. This information is not intended to replace advice given to you by your health care provider. Make sure you discuss any questions you have with your health care provider. Document Revised: 11/03/2019 Document Reviewed: 11/03/2019 Elsevier Patient Education  2021 Elsevier Inc.  

## 2021-03-28 ENCOUNTER — Encounter: Payer: Self-pay | Admitting: Physician Assistant

## 2021-03-28 DIAGNOSIS — E1140 Type 2 diabetes mellitus with diabetic neuropathy, unspecified: Secondary | ICD-10-CM

## 2021-03-29 ENCOUNTER — Other Ambulatory Visit: Payer: Self-pay | Admitting: Physician Assistant

## 2021-03-29 DIAGNOSIS — E1140 Type 2 diabetes mellitus with diabetic neuropathy, unspecified: Secondary | ICD-10-CM

## 2021-03-29 MED ORDER — ACCU-CHEK SOFTCLIX LANCET DEV KIT: PACK | 1 refills | Status: AC

## 2021-04-04 MED ORDER — ACCU-CHEK SOFTCLIX LANCETS MISC: 12 refills | Status: DC

## 2021-05-09 ENCOUNTER — Other Ambulatory Visit: Payer: Self-pay | Admitting: Physician Assistant

## 2021-05-09 DIAGNOSIS — E7800 Pure hypercholesterolemia, unspecified: Secondary | ICD-10-CM

## 2021-05-09 DIAGNOSIS — G4700 Insomnia, unspecified: Secondary | ICD-10-CM

## 2021-05-16 DIAGNOSIS — E049 Nontoxic goiter, unspecified: Secondary | ICD-10-CM | POA: Diagnosis not present

## 2021-05-16 DIAGNOSIS — E0500 Thyrotoxicosis with diffuse goiter without thyrotoxic crisis or storm: Secondary | ICD-10-CM | POA: Diagnosis not present

## 2021-05-16 DIAGNOSIS — Z6828 Body mass index (BMI) 28.0-28.9, adult: Secondary | ICD-10-CM | POA: Diagnosis not present

## 2021-05-16 DIAGNOSIS — E0590 Thyrotoxicosis, unspecified without thyrotoxic crisis or storm: Secondary | ICD-10-CM | POA: Diagnosis not present

## 2021-05-17 ENCOUNTER — Ambulatory Visit
Admission: RE | Admit: 2021-05-17 | Discharge: 2021-05-17 | Disposition: A | Discharge status: Home / Self Care | Payer: Medicare Other | Source: Ambulatory Visit | Attending: Physician Assistant | Admitting: Physician Assistant

## 2021-05-17 ENCOUNTER — Other Ambulatory Visit: Payer: Self-pay

## 2021-05-17 DIAGNOSIS — M79642 Pain in left hand: Secondary | ICD-10-CM | POA: Diagnosis not present

## 2021-05-17 DIAGNOSIS — M79641 Pain in right hand: Secondary | ICD-10-CM | POA: Diagnosis not present

## 2021-06-23 ENCOUNTER — Other Ambulatory Visit: Payer: Medicare Other

## 2021-06-23 ENCOUNTER — Other Ambulatory Visit: Payer: Self-pay

## 2021-06-23 DIAGNOSIS — E1140 Type 2 diabetes mellitus with diabetic neuropathy, unspecified: Secondary | ICD-10-CM

## 2021-06-24 LAB — HEMOGLOBIN A1C
Est. average glucose Bld gHb Est-mCnc: 114 mg/dL
Hgb A1c MFr Bld: 5.6 % (ref 4.8–5.6)

## 2021-06-28 ENCOUNTER — Ambulatory Visit (INDEPENDENT_AMBULATORY_CARE_PROVIDER_SITE_OTHER): Payer: Medicare Other | Admitting: Physician Assistant

## 2021-06-28 ENCOUNTER — Encounter: Payer: Self-pay | Admitting: Physician Assistant

## 2021-06-28 ENCOUNTER — Other Ambulatory Visit: Payer: Self-pay

## 2021-06-28 VITALS — BP 132/80 | HR 97 | Temp 98.2°F | Ht 68.0 in | Wt 185.8 lb

## 2021-06-28 DIAGNOSIS — E1159 Type 2 diabetes mellitus with other circulatory complications: Secondary | ICD-10-CM

## 2021-06-28 DIAGNOSIS — M79642 Pain in left hand: Secondary | ICD-10-CM

## 2021-06-28 DIAGNOSIS — G4700 Insomnia, unspecified: Secondary | ICD-10-CM

## 2021-06-28 DIAGNOSIS — E1140 Type 2 diabetes mellitus with diabetic neuropathy, unspecified: Secondary | ICD-10-CM | POA: Diagnosis not present

## 2021-06-28 DIAGNOSIS — I152 Hypertension secondary to endocrine disorders: Secondary | ICD-10-CM

## 2021-06-28 DIAGNOSIS — M79641 Pain in right hand: Secondary | ICD-10-CM | POA: Diagnosis not present

## 2021-06-28 DIAGNOSIS — E1169 Type 2 diabetes mellitus with other specified complication: Secondary | ICD-10-CM | POA: Diagnosis not present

## 2021-06-28 DIAGNOSIS — E785 Hyperlipidemia, unspecified: Secondary | ICD-10-CM

## 2021-06-28 NOTE — Progress Notes (Signed)
Established Patient Office Visit  Subjective:  Patient ID: Susan Sandoval, female    DOB: [birth year]  Age: 65 y.o. MRN: [id number]  CC:  Chief Complaint  Patient presents with   Follow-up    Insomina   Diabetes   Hyperlipidemia    HPI [name] presents for follow-up on diabetes mellitus, hyperlipidemia and insomnia.  Patient reports her left hand pain has improved, realized pain onset was when she was using her iPad so purchased an iPad stand which has worked.  Continues to have intermittent right hand pain especially when using her fingers and sometimes has stiffness in the mornings, so far symptoms are manageable.  Diabetes: Pt denies increased urination or thirst. Pt reports takes metformin depending on sugar readings.  No hypoglycemic events, <70. Checking glucose at home.  Reports fasting blood sugars average in the 110s.  Continues with monitoring carbohydrate and glucose intake.  Reports has been staying active with doing door activities, fishing and swimming (has a pool spa).  HLD: Pt taking medication as directed without issues.  Continue routine monitoring fat intake.  Insomnia: At last visit patient was started on trazodone to help with sleep.  Reports she took half a tablet for her first dose which did not work and then tried full tablet which made her too drowsy and experienced some side effects with palpitations.  She tried half a tablet again which seemed to work and did not feel as drowsy the next morning.  Takes medication every other day which has worked well.  HTN: Pt denies chest pain, palpitations, dizziness or edema. Taking medication as directed without side effects. Checks BP at home and has noticed BP readings are trending higher than usually, reports readings range in 130s/70-80s. Pt follows a low salt diet.  Reports has increased her water intake.   Past Medical History:  Diagnosis Date   Diabetes mellitus without complication ([place])    Hyperlipidemia     Hypertension     Past Surgical History:  Procedure Laterality Date   CESAREAN SECTION     CHOLECYSTECTOMY     EYE SURGERY     childhood   SPINE SURGERY     discectomy/ laminectomy L4-L5   WRIST SURGERY Right     Family History  Problem Relation Age of Onset   Hypertension Mother    Alzheimer's disease Mother    Diabetes Father    Hypertension Father    Stroke Father    Diabetes Sister    Hypertension Sister    Hypertension Maternal Grandmother    Cancer Maternal Grandmother        intestinal    Alcohol abuse Maternal Grandfather    Stroke Maternal Grandfather    Stroke Paternal Grandmother    Hypertension Paternal Grandfather    Stroke Paternal Grandfather     Social History   Socioeconomic History   Marital status: Married    Spouse name: Not on file   Number of children: Not on file   Years of education: Not on file   Highest education level: Not on file  Occupational History   Not on file  Tobacco Use   Smoking status: Never   Smokeless tobacco: Never  Vaping Use   Vaping Use: Never used  Substance and Sexual Activity   Alcohol use: Yes    Alcohol/week: 2.0 standard drinks    Types: 2 Glasses of wine per week   Drug use: Never   Sexual activity: Yes  Other Topics  Concern   Not on file  Social History Narrative   Not on file   Social Determinants of Health   Financial Resource Strain: Not on file  Food Insecurity: Not on file  Transportation Needs: Not on file  Physical Activity: Not on file  Stress: Not on file  Social Connections: Not on file  Intimate Partner Violence: Not on file    Outpatient Medications Prior to Visit  Medication Sig Dispense Refill   Accu-Chek Softclix Lancets lancets Use to check blood sugars every morning fasting and 2 hours after largest meal 100 each 12   calcium carbonate (OS-CAL) 1250 (500 Ca) MG chewable tablet Chew 1 tablet by mouth daily.     clobetasol (TEMOVATE) 0.05 % external solution APPLY 1 APPLICATION         TOPICALLY TWO TIMES A DAY 50 mL 0   cyclobenzaprine (FLEXERIL) 10 MG tablet Take 1 tablet (10 mg total) by mouth 3 (three) times daily as needed for muscle spasms. 60 tablet 0   enalapril (VASOTEC) 5 MG tablet Take 1 tablet (5 mg total) by mouth daily. 90 tablet 3   Fenofibrate 50 MG CAPS TAKE 1 CAPSULE DAILY 90 capsule 0   glucose blood (ACCU-CHEK GUIDE) test strip Use as instructed 100 each 12   hydrochlorothiazide (HYDRODIURIL) 12.5 MG tablet Take 1 tablet (12.5 mg total) by mouth daily. 90 tablet 3   Lancets Misc. (ACCU-CHEK SOFTCLIX LANCET DEV) KIT Use to check blood sugars every morning fasting and 2 hours after largest meal 1 kit 1   metFORMIN (GLUCOPHAGE) 500 MG tablet Take 1 tablet (500 mg total) by mouth daily with breakfast. 90 tablet 0   methimazole (TAPAZOLE) 5 MG tablet Take 5 mg by mouth daily.     traZODone (DESYREL) 50 MG tablet TAKE 1/2 TO 1 TABLET (25-50MG TOTAL) AT BEDTIME AS    NEEDED FOR SLEEP 30 tablet 0   No facility-administered medications prior to visit.    Allergies  Allergen Reactions   Statins Palpitations    "crawling in legs"    ROS Review of Systems A fourteen system review of systems was performed and found to be positive as per HPI.   Objective:    Physical Exam General:  Well Developed, well nourished, appropriate for stated age.  Neuro:  Alert and oriented,  extra-ocular muscles intact  HEENT:  Normocephalic, atraumatic, neck supple Skin:  no gross rash, warm, pink. Cardiac:  RRR, S1 S2 Respiratory:  ECTA B/L, Not using accessory muscles, speaking in full sentences- unlabored. Vascular:  Ext warm, no cyanosis apprec.; cap RF less 2 sec. No  gross edema  Psych:  No HI/SI, judgement and insight good, Euthymic mood. Full Affect.  BP 132/80   Pulse 97   Temp 98.2 F (36.8 C)   Ht 5' 8"  (1.727 m)   Wt 185 lb 12.8 oz (84.3 kg)   SpO2 97%   BMI 28.25 kg/m  Wt Readings from Last 3 Encounters:  06/28/21 185 lb 12.8 oz (84.3 kg)   03/27/21 187 lb 11.2 oz (85.1 kg)  12/22/20 180 lb (81.6 kg)     Health Maintenance Due  Topic Date Due   OPHTHALMOLOGY EXAM  Never done   HIV Screening  Never done   Hepatitis C Screening  Never done   PAP SMEAR-Modifier  Never done   COLONOSCOPY (Pts 45-8yr Insurance coverage will need to be confirmed)  Never done   MAMMOGRAM  Never done   CPage  FOBT  11/13/2019   Zoster Vaccines- Shingrix (2 of 2) 12/06/2019   FOOT EXAM  01/19/2021   DEXA SCAN  Never done   PNA vac Low Risk Adult (1 of 2 - PCV13) 06/03/2021    There are no preventive care reminders to display for this patient.  Lab Results  Component Value Date   TSH 0.270 (L) 06/14/2020   Lab Results  Component Value Date   WBC 6.5 12/14/2019   HGB 15.3 12/14/2019   HCT 44.5 12/14/2019   MCV 91 12/14/2019   PLT 204 12/14/2019   Lab Results  Component Value Date   NA 145 (H) 03/21/2021   K 3.7 03/21/2021   CO2 23 03/21/2021   GLUCOSE 107 (H) 03/21/2021   BUN 20 03/21/2021   CREATININE 0.80 03/21/2021   BILITOT <0.2 03/21/2021   ALKPHOS 53 03/21/2021   AST 16 03/21/2021   ALT 18 03/21/2021   PROT 6.4 03/21/2021   ALBUMIN 4.2 03/21/2021   CALCIUM 9.2 03/21/2021   EGFR 82 03/21/2021   Lab Results  Component Value Date   CHOL 213 (H) 03/21/2021   Lab Results  Component Value Date   HDL 47 03/21/2021   Lab Results  Component Value Date   LDLCALC 153 (H) 03/21/2021   Lab Results  Component Value Date   TRIG 73 03/21/2021   Lab Results  Component Value Date   CHOLHDL 4.5 (H) 03/21/2021   Lab Results  Component Value Date   HGBA1C 5.6 06/23/2021      Assessment & Plan:   Problem List Items Addressed This Visit       Cardiovascular and Mediastinum   Hypertension associated with type 2 diabetes mellitus (HCC)    - BP stable. - Continue current medication regimen. - Continue ambulatory BP monitoring and advised to notify the clinic if BP consistently >140/90. -  Will continue to monitor.          Endocrine   Type 2 diabetes mellitus with diabetic neuropathy, unspecified ([place]) - Primary    -Recent A1c improved from 5.7 to 5.6, at goal. -Recommend to continue with Metformin 500 mg once daily. -Continue ambulatory glucose monitoring and notify clinic if FBS consistently <80 or >160. -Continue with low glucose/carbohydrate diet. -Will continue to monitor.        Hyperlipidemia associated with type 2 diabetes mellitus (HCC)    -Last lipid panel: Total cholesterol 213, triglycerides 73, HDL 47, LDL 153 -Patient has a history of statin intolerance and tolerating fenofibrate so we will continue current medication regimen. -Continue low-fat diet. -Will repeat lipid panel at follow-up visit.       Other Visit Diagnoses     Pain in both hands       Insomnia, unspecified type          Insomnia: -Improved.   -Continue current medication regimen. -Will monitor.  Pain in both hands: -Improvement of left hand. Advised if symptoms fail to improve or worsen recommend referral to hand specialist/ortho. -Right hand XR 05/17/2021: FINDINGS: There is no evidence of acute fracture or dislocation. There is no evidence of arthropathy. Status post surgical internal fixation of old distal radial fracture. Soft tissues are unremarkable.  IMPRESSION: Old distal right radial fracture.  No other abnormality seen. -Left hand XR 05/17/2021: FINDINGS: There is no evidence of fracture or dislocation. There is no evidence of arthropathy or other focal bone abnormality. Soft tissues are unremarkable.  IMPRESSION: Negative.  No orders of the defined types  were placed in this encounter.   Follow-up: Return in about 3 months (around 09/28/2021) for Welcome to Medicare and FBW few days prior.   Note:  This note was prepared with assistance of Dragon voice recognition software. Occasional wrong-word or sound-a-like substitutions may have occurred due to the  inherent limitations of voice recognition software.  Lorrene Reid, PA-C

## 2021-06-28 NOTE — Assessment & Plan Note (Signed)
-  Recent A1c improved from 5.7 to 5.6, at goal. -Recommend to continue with Metformin 500 mg once daily. -Continue ambulatory glucose monitoring and notify clinic if FBS consistently <80 or >160. -Continue with low glucose/carbohydrate diet. -Will continue to monitor.

## 2021-06-28 NOTE — Patient Instructions (Signed)
Preventive Care 65-65 Years Old, Female Preventive care refers to lifestyle choices and visits with your health care provider that can promote health and wellness. This includes: A yearly physical exam. This is also called an annual wellness visit. Regular dental and eye exams. Immunizations. Screening for certain conditions. Healthy lifestyle choices, such as: Eating a healthy diet. Getting regular exercise. Not using drugs or products that contain nicotine and tobacco. Limiting alcohol use. What can I expect for my preventive care visit? Physical exam Your health care provider will check your: Height and weight. These may be used to calculate your BMI (body mass index). BMI is a measurement that tells if you are at a healthy weight. Heart rate and blood pressure. Body temperature. Skin for abnormal spots. Counseling Your health care provider may ask you questions about your: Past medical problems. Family's medical history. Alcohol, tobacco, and drug use. Emotional well-being. Home life and relationship well-being. Sexual activity. Diet, exercise, and sleep habits. Work and work Statistician. Access to firearms. Method of birth control. Menstrual cycle. Pregnancy history. What immunizations do I need?  Vaccines are usually given at various ages, according to a schedule. Your health care provider will recommend vaccines for you based on your age, medicalhistory, and lifestyle or other factors, such as travel or where you work. What tests do I need? Blood tests Lipid and cholesterol levels. These may be checked every 5 years, or more often if you are over 65 years old. Hepatitis C test. Hepatitis B test. Screening Lung cancer screening. You may have this screening every year starting at age 65 if you have a 30-pack-year history of smoking and currently smoke or have quit within the past 15 years. Colorectal cancer screening. All adults should have this screening starting at  age 65 and continuing until age 3. Your health care provider may recommend screening at age 65 if you are at increased risk. You will have tests every 1-10 years, depending on your results and the type of screening test. Diabetes screening. This is done by checking your blood sugar (glucose) after you have not eaten for a while (fasting). You may have this done every 1-3 years. Mammogram. This may be done every 1-2 years. Talk with your health care provider about when you should start having regular mammograms. This may depend on whether you have a family history of breast cancer. BRCA-related cancer screening. This may be done if you have a family history of breast, ovarian, tubal, or peritoneal cancers. Pelvic exam and Pap test. This may be done every 3 years starting at age 65. Starting at age 65, this may be done every 5 years if you have a Pap test in combination with an HPV test. Other tests STD (sexually transmitted disease) testing, if you are at risk. Bone density scan. This is done to screen for osteoporosis. You may have this scan if you are at high risk for osteoporosis. Talk with your health care provider about your test results, treatment options,and if necessary, the need for more tests. Follow these instructions at home: Eating and drinking  Eat a diet that includes fresh fruits and vegetables, whole grains, lean protein, and low-fat dairy products. Take vitamin and mineral supplements as recommended by your health care provider. Do not drink alcohol if: Your health care provider tells you not to drink. You are pregnant, may be pregnant, or are planning to become pregnant. If you drink alcohol: Limit how much you have to 0-1 drink a day. Be aware  of how much alcohol is in your drink. In the [place] one drink equals one 12 oz bottle of beer (355 mL), one 5 oz glass of wine (148 mL), or one 1 oz glass of hard liquor (44 mL).  Lifestyle Take daily care of your teeth and  gums. Brush your teeth every morning and night with fluoride toothpaste. Floss one time each day. Stay active. Exercise for at least 30 minutes 5 or more days each week. Do not use any products that contain nicotine or tobacco, such as cigarettes, e-cigarettes, and chewing tobacco. If you need help quitting, ask your health care provider. Do not use drugs. If you are sexually active, practice safe sex. Use a condom or other form of protection to prevent STIs (sexually transmitted infections). If you do not wish to become pregnant, use a form of birth control. If you plan to become pregnant, see your health care provider for a prepregnancy visit. If told by your health care provider, take low-dose aspirin daily starting at age 65. Find healthy ways to cope with stress, such as: Meditation, yoga, or listening to music. Journaling. Talking to a trusted person. Spending time with friends and family. Safety Always wear your seat belt while driving or riding in a vehicle. Do not drive: If you have been drinking alcohol. Do not ride with someone who has been drinking. When you are tired or distracted. While texting. Wear a helmet and other protective equipment during sports activities. If you have firearms in your house, make sure you follow all gun safety procedures. What's next? Visit your health care provider once a year for an annual wellness visit. Ask your health care provider how often you should have your eyes and teeth checked. Stay up to date on all vaccines. This information is not intended to replace advice given to you by your health care provider. Make sure you discuss any questions you have with your healthcare provider. Document Revised: 08/30/2020 Document Reviewed: 08/07/2018 Elsevier Patient Education  2022 [employer].

## 2021-06-28 NOTE — Assessment & Plan Note (Signed)
-   BP stable. - Continue current medication regimen. - Continue ambulatory BP monitoring and advised to notify the clinic if BP consistently >140/90. - Will continue to monitor.

## 2021-06-28 NOTE — Assessment & Plan Note (Signed)
-  Last lipid panel: Total cholesterol 213, triglycerides 73, HDL 47, LDL 153 -Patient has a history of statin intolerance and tolerating fenofibrate so we will continue current medication regimen. -Continue low-fat diet. -Will repeat lipid panel at follow-up visit.

## 2021-06-29 LAB — POCT UA - MICROALBUMIN
Albumin/Creatinine Ratio, Urine, POC: <30
Creatinine, POC: 200 mg/dL
Microalbumin Ur, POC: 30 mg/L

## 2021-06-29 NOTE — Addendum Note (Signed)
Addended by: Lupita Leash on: 06/29/2021 11:38 AM   Modules accepted: Orders

## 2021-09-03 ENCOUNTER — Other Ambulatory Visit: Payer: Self-pay | Admitting: Physician Assistant

## 2021-09-03 DIAGNOSIS — E7800 Pure hypercholesterolemia, unspecified: Secondary | ICD-10-CM

## 2021-09-03 DIAGNOSIS — G4700 Insomnia, unspecified: Secondary | ICD-10-CM

## 2021-09-21 ENCOUNTER — Other Ambulatory Visit: Payer: Self-pay

## 2021-09-21 ENCOUNTER — Other Ambulatory Visit: Payer: Medicare Other

## 2021-09-21 DIAGNOSIS — E1140 Type 2 diabetes mellitus with diabetic neuropathy, unspecified: Secondary | ICD-10-CM

## 2021-09-21 DIAGNOSIS — E1169 Type 2 diabetes mellitus with other specified complication: Secondary | ICD-10-CM

## 2021-09-21 DIAGNOSIS — E0590 Thyrotoxicosis, unspecified without thyrotoxic crisis or storm: Secondary | ICD-10-CM

## 2021-09-21 DIAGNOSIS — E785 Hyperlipidemia, unspecified: Secondary | ICD-10-CM

## 2021-09-21 DIAGNOSIS — E1159 Type 2 diabetes mellitus with other circulatory complications: Secondary | ICD-10-CM

## 2021-09-22 LAB — COMPREHENSIVE METABOLIC PANEL
ALT: 14 IU/L (ref 0–32)
AST: 17 IU/L (ref 0–40)
Albumin/Globulin Ratio: 2 (ref 1.2–2.2)
Albumin: 4.3 g/dL (ref 3.8–4.8)
Alkaline Phosphatase: 53 IU/L (ref 44–121)
BUN/Creatinine Ratio: 22 (ref 12–28)
BUN: 18 mg/dL (ref 8–27)
Bilirubin Total: 0.4 mg/dL (ref 0.0–1.2)
CO2: 25 mmol/L (ref 20–29)
Calcium: 9.5 mg/dL (ref 8.7–10.3)
Chloride: 106 mmol/L (ref 96–106)
Creatinine, Ser: 0.82 mg/dL (ref 0.57–1.00)
Globulin, Total: 2.2 g/dL (ref 1.5–4.5)
Glucose: 128 mg/dL — ABNORMAL HIGH (ref 70–99)
Potassium: 3.8 mmol/L (ref 3.5–5.2)
Sodium: 144 mmol/L (ref 134–144)
Total Protein: 6.5 g/dL (ref 6.0–8.5)
eGFR: 79 mL/min/{1.73_m2} (ref >59)

## 2021-09-22 LAB — CBC WITH DIFFERENTIAL/PLATELET
Basophils Absolute: 0 10*3/uL (ref 0.0–0.2)
Basos: 1 %
EOS (ABSOLUTE): 0.1 10*3/uL (ref 0.0–0.4)
Eos: 2 %
Hematocrit: 42.1 % (ref 34.0–46.6)
Hemoglobin: 14.2 g/dL (ref 11.1–15.9)
Immature Grans (Abs): 0 10*3/uL (ref 0.0–0.1)
Immature Granulocytes: 0 %
Lymphocytes Absolute: 1.2 10*3/uL (ref 0.7–3.1)
Lymphs: 20 %
MCH: 30.9 pg (ref 26.6–33.0)
MCHC: 33.7 g/dL (ref 31.5–35.7)
MCV: 92 fL (ref 79–97)
Monocytes Absolute: 0.4 10*3/uL (ref 0.1–0.9)
Monocytes: 7 %
Neutrophils Absolute: 4.1 10*3/uL (ref 1.4–7.0)
Neutrophils: 70 %
Platelets: 194 10*3/uL (ref 150–450)
RBC: 4.59 x10E6/uL (ref 3.77–5.28)
RDW: 12.7 % (ref 11.7–15.4)
WBC: 5.8 10*3/uL (ref 3.4–10.8)

## 2021-09-22 LAB — TSH: TSH: 1.99 u[IU]/mL (ref 0.450–4.500)

## 2021-09-22 LAB — LIPID PANEL
Chol/HDL Ratio: 4.3 ratio (ref 0.0–4.4)
Cholesterol, Total: 213 mg/dL — ABNORMAL HIGH (ref 100–199)
HDL: 50 mg/dL (ref >39)
LDL Chol Calc (NIH): 146 mg/dL — ABNORMAL HIGH (ref 0–99)
Triglycerides: 93 mg/dL (ref 0–149)
VLDL Cholesterol Cal: 17 mg/dL (ref 5–40)

## 2021-09-22 LAB — HEMOGLOBIN A1C
Est. average glucose Bld gHb Est-mCnc: 117 mg/dL
Hgb A1c MFr Bld: 5.7 % — ABNORMAL HIGH (ref 4.8–5.6)

## 2021-09-26 ENCOUNTER — Encounter: Payer: Self-pay | Admitting: Physician Assistant

## 2021-09-26 LAB — HM DIABETES EYE EXAM

## 2021-09-28 ENCOUNTER — Other Ambulatory Visit: Payer: Self-pay

## 2021-09-28 ENCOUNTER — Encounter: Payer: Self-pay | Admitting: Physician Assistant

## 2021-09-28 ENCOUNTER — Ambulatory Visit (INDEPENDENT_AMBULATORY_CARE_PROVIDER_SITE_OTHER): Payer: Medicare Other | Admitting: Physician Assistant

## 2021-09-28 VITALS — BP 139/85 | HR 62 | Temp 97.7°F | Ht 68.0 in | Wt 189.0 lb

## 2021-09-28 DIAGNOSIS — R9431 Abnormal electrocardiogram [ECG] [EKG]: Secondary | ICD-10-CM

## 2021-09-28 DIAGNOSIS — Z0000 Encounter for general adult medical examination without abnormal findings: Secondary | ICD-10-CM | POA: Diagnosis not present

## 2021-09-28 DIAGNOSIS — Z1211 Encounter for screening for malignant neoplasm of colon: Secondary | ICD-10-CM | POA: Diagnosis not present

## 2021-09-28 DIAGNOSIS — Z23 Encounter for immunization: Secondary | ICD-10-CM

## 2021-09-28 NOTE — Patient Instructions (Signed)
Preventive Care 40 Years and Older, Female Preventive care refers to lifestyle choices and visits with your health care provider that can promote health and wellness. This includes: A yearly physical exam. This is also called an annual wellness visit. Regular dental and eye exams. Immunizations. Screening for certain conditions. Healthy lifestyle choices, such as: Eating a healthy diet. Getting regular exercise. Not using drugs or products that contain nicotine and tobacco. Limiting alcohol use. What can I expect for my preventive care visit? Physical exam Your health care provider will check your: Height and weight. These may be used to calculate your BMI (body mass index). BMI is a measurement that tells if you are at a healthy weight. Heart rate and blood pressure. Body temperature. Skin for abnormal spots. Counseling Your health care provider may ask you questions about your: Past medical problems. Family's medical history. Alcohol, tobacco, and drug use. Emotional well-being. Home life and relationship well-being. Sexual activity. Diet, exercise, and sleep habits. History of falls. Memory and ability to understand (cognition). Work and work Statistician. Pregnancy and menstrual history. Access to firearms. What immunizations do I need? Vaccines are usually given at various ages, according to a schedule. Your health care provider will recommend vaccines for you based on your age, medical history, and lifestyle or other factors, such as travel or where you work. What tests do I need? Blood tests Lipid and cholesterol levels. These may be checked every 5 years, or more often depending on your overall health. Hepatitis C test. Hepatitis B test. Screening Lung cancer screening. You may have this screening every year starting at age 65 if you have a 30-pack-year history of smoking and currently smoke or have quit within the past 15 years. Colorectal cancer screening. All  adults should have this screening starting at age 65 and continuing until age 9. Your health care provider may recommend screening at age 65 if you are at increased risk. You will have tests every 1-10 years, depending on your results and the type of screening test. Diabetes screening. This is done by checking your blood sugar (glucose) after you have not eaten for a while (fasting). You may have this done every 1-3 years. Mammogram. This may be done every 1-2 years. Talk with your health care provider about how often you should have regular mammograms. Abdominal aortic aneurysm (AAA) screening. You may need this if you are a current or former smoker. BRCA-related cancer screening. This may be done if you have a family history of breast, ovarian, tubal, or peritoneal cancers. Other tests STD (sexually transmitted disease) testing, if you are at risk. Bone density scan. This is done to screen for osteoporosis. You may have this done starting at age 65. Talk with your health care provider about your test results, treatment options, and if necessary, the need for more tests. Follow these instructions at home: Eating and drinking  Eat a diet that includes fresh fruits and vegetables, whole grains, lean protein, and low-fat dairy products. Limit your intake of foods with high amounts of sugar, saturated fats, and salt. Take vitamin and mineral supplements as recommended by your health care provider. Do not drink alcohol if your health care provider tells you not to drink. If you drink alcohol: Limit how much you have to 0-1 drink a day. Be aware of how much alcohol is in your drink. In the [place] one drink equals one 12 oz bottle of beer (355 mL), one 5 oz glass of wine (148 mL), or one  1 oz glass of hard liquor (44 mL). Lifestyle Take daily care of your teeth and gums. Brush your teeth every morning and night with fluoride toothpaste. Floss one time each day. Stay active. Exercise for at least  30 minutes 5 or more days each week. Do not use any products that contain nicotine or tobacco, such as cigarettes, e-cigarettes, and chewing tobacco. If you need help quitting, ask your health care provider. Do not use drugs. If you are sexually active, practice safe sex. Use a condom or other form of protection in order to prevent STIs (sexually transmitted infections). Talk with your health care provider about taking a low-dose aspirin or statin. Find healthy ways to cope with stress, such as: Meditation, yoga, or listening to music. Journaling. Talking to a trusted person. Spending time with friends and family. Safety Always wear your seat belt while driving or riding in a vehicle. Do not drive: If you have been drinking alcohol. Do not ride with someone who has been drinking. When you are tired or distracted. While texting. Wear a helmet and other protective equipment during sports activities. If you have firearms in your house, make sure you follow all gun safety procedures. What's next? Visit your health care provider once a year for an annual wellness visit. Ask your health care provider how often you should have your eyes and teeth checked. Stay up to date on all vaccines. This information is not intended to replace advice given to you by your health care provider. Make sure you discuss any questions you have with your health care provider. Document Revised: 02/03/2021 Document Reviewed: 11/20/2018 Elsevier Patient Education  2022 [employer].

## 2021-09-28 NOTE — Progress Notes (Signed)
Subjective:   Susan Sandoval is a 65 y.o. female who presents for an Initial Medicare Annual Wellness Visit.  Review of Systems    General:   No F/C, wt loss Pulm:   No DIB, SOB, pleuritic chest pain Card:  No CP, palpitations Abd:  No n/v/d or pain Ext:  No inc edema from baseline     Objective:    Today's Vitals   09/28/21 1045 09/28/21 1200  BP: (!) 142/77 139/85  Pulse: 62   Temp: 97.7 F (36.5 C)   SpO2: 98%   Weight: 189 lb (85.7 kg)   Height: 5' 8"  (1.727 m)    Body mass index is 28.74 kg/m.  Advanced Directives 12/09/2019  Does Patient Have a Medical Advance Directive? No  Would patient like information on creating a medical advance directive? No - Patient declined    Current Medications (verified) Outpatient Encounter Medications as of 09/28/2021  Medication Sig   Accu-Chek Softclix Lancets lancets Use to check blood sugars every morning fasting and 2 hours after largest meal   calcium carbonate (OS-CAL) 1250 (500 Ca) MG chewable tablet Chew 1 tablet by mouth daily.   clobetasol (TEMOVATE) 0.05 % external solution APPLY 1 APPLICATION        TOPICALLY TWO TIMES A DAY   cyclobenzaprine (FLEXERIL) 10 MG tablet Take 1 tablet (10 mg total) by mouth 3 (three) times daily as needed for muscle spasms.   enalapril (VASOTEC) 5 MG tablet Take 1 tablet (5 mg total) by mouth daily.   Fenofibrate 50 MG CAPS TAKE 1 CAPSULE DAILY   glucose blood (ACCU-CHEK GUIDE) test strip Use as instructed   hydrochlorothiazide (HYDRODIURIL) 12.5 MG tablet Take 1 tablet (12.5 mg total) by mouth daily.   Lancets Misc. (ACCU-CHEK SOFTCLIX LANCET DEV) KIT Use to check blood sugars every morning fasting and 2 hours after largest meal   metFORMIN (GLUCOPHAGE) 500 MG tablet Take 1 tablet (500 mg total) by mouth daily with breakfast.   methimazole (TAPAZOLE) 5 MG tablet Take 5 mg by mouth daily.   traZODone (DESYREL) 50 MG tablet TAKE 1/2 TO 1 TABLET (25-50MG TOTAL) AT BEDTIME AS    NEEDED FOR SLEEP    No facility-administered encounter medications on file as of 09/28/2021.    Allergies (verified) Statins   History: Past Medical History:  Diagnosis Date   Diabetes mellitus without complication ([place])    Hyperlipidemia    Hypertension    Past Surgical History:  Procedure Laterality Date   CESAREAN SECTION     CHOLECYSTECTOMY     EYE SURGERY     childhood   SPINE SURGERY     discectomy/ laminectomy L4-L5   WRIST SURGERY Right    Family History  Problem Relation Age of Onset   Hypertension Mother    Alzheimer's disease Mother    Diabetes Father    Hypertension Father    Stroke Father    Diabetes Sister    Hypertension Sister    Hypertension Maternal Grandmother    Cancer Maternal Grandmother        intestinal    Alcohol abuse Maternal Grandfather    Stroke Maternal Grandfather    Stroke Paternal Grandmother    Hypertension Paternal Grandfather    Stroke Paternal Grandfather    Social History   Socioeconomic History   Marital status: Married    Spouse name: Not on file   Number of children: Not on file   Years of education: Not on file  Highest education level: Not on file  Occupational History   Not on file  Tobacco Use   Smoking status: Never   Smokeless tobacco: Never  Vaping Use   Vaping Use: Never used  Substance and Sexual Activity   Alcohol use: Yes    Alcohol/week: 2.0 standard drinks    Types: 2 Glasses of wine per week   Drug use: Never   Sexual activity: Yes  Other Topics Concern   Not on file  Social History Narrative   Not on file   Social Determinants of Health   Financial Resource Strain: Not on file  Food Insecurity: Not on file  Transportation Needs: Not on file  Physical Activity: Not on file  Stress: Not on file  Social Connections: Not on file    Tobacco Counseling Counseling given: Not Answered     Diabetic? yes   Activities of Daily Living In your present state of health, do you have any difficulty  performing the following activities: 09/28/2021 06/28/2021  Hearing? N N  Vision? N N  Difficulty concentrating or making decisions? N N  Walking or climbing stairs? N N  Dressing or bathing? N N  Doing errands, shopping? N N  Some recent data might be hidden    Patient Care Team: Lorrene Reid, PA-C as PCP - General (Physician Assistant)  Indicate any recent Medical Services you may have received from other than [facility] providers in the past year (date may be approximate).     Assessment:   This is a routine wellness examination for [name].  Hearing/Vision screen No results found.  Dietary issues and exercise activities discussed: -Continues to exercise with water aerobics and monitoring low fat and carbohydrate diet.   Goals Addressed   None   Depression Screen PHQ 2/9 Scores 09/28/2021 06/28/2021 03/27/2021 12/22/2020 09/15/2020 05/10/2020 01/20/2020  PHQ - 2 Score 0 0 0 0 0 1 0  PHQ- 9 Score 2 3 5  0 4 4 3     Fall Risk Fall Risk  09/28/2021 06/28/2021 03/27/2021 12/22/2020 09/15/2020  Falls in the past year? 0 1 1 1  0  Number falls in past yr: 0 0 0 1 -  Injury with Fall? 0 0 0 0 -  Risk for fall due to : No Fall Risks No Fall Risks - History of fall(s) History of fall(s)  Follow up Falls evaluation completed Falls evaluation completed Falls evaluation completed Falls evaluation completed Falls evaluation completed    [place]:  Any stairs in or around the home? Yes  If so, are there any without handrails? No  Home free of loose throw rugs in walkways, pet beds, electrical cords, etc? Yes  Adequate lighting in your home to reduce risk of falls? Yes   ASSISTIVE DEVICES UTILIZED TO PREVENT FALLS:  Life alert? No  Use of a cane, walker or w/c? No  Grab bars in the bathroom? No  Shower chair or bench in shower? Yes  Elevated toilet seat or a handicapped toilet? No   TIMED UP AND GO:  Was the test performed? Yes .  Length of time to  ambulate 10 feet: 12 sec.   Gait slow and steady without use of assistive device  Cognitive Function: wnl's   6CIT Screen 09/28/2021  What Year? 0 points  What month? 0 points  What time? 0 points  Count back from 20 0 points  Months in reverse 0 points  Repeat phrase 0 points  Total Score  0    Immunizations Immunization History  Administered Date(s) Administered   Fluad Quad(high Dose 65+) 09/28/2021   Influenza-Unspecified 09/10/2019, 09/14/2020   PFIZER(Purple Top)SARS-COV-2 Vaccination 02/23/2020, 03/15/2020, 09/14/2020, 03/16/2021, 08/25/2021   Tdap 02/05/2018   Zoster Recombinat (Shingrix) 10/11/2019    TDAP status: Up to date  Flu Vaccine status: Completed at today's visit  Pneumococcal vaccine status: Due, Education has been provided regarding the importance of this vaccine. Advised may receive this vaccine at local pharmacy or Health Dept. Aware to provide a copy of the vaccination record if obtained from local pharmacy or Health Dept. Verbalized acceptance and understanding.  Covid-19 vaccine status: Completed vaccines  Qualifies for Shingles Vaccine? Yes   Zostavax completed No   Shingrix Completed?: No.    Education has been provided regarding the importance of this vaccine. Patient has been advised to call insurance company to determine out of pocket expense if they have not yet received this vaccine. Advised may also receive vaccine at local pharmacy or Health Dept. Verbalized acceptance and understanding. Patient has had one shingles vaccine.   Screening Tests Health Maintenance  Topic Date Due   Pneumonia Vaccine 59+ Years old (1 - PCV) Never done   HIV Screening  Never done   Hepatitis C Screening  Never done   PAP SMEAR-Modifier  Never done   COLONOSCOPY (Pts 45-39yr Insurance coverage will need to be confirmed)  Never done   MAMMOGRAM  Never done   COLON CANCER SCREENING ANNUAL FOBT  11/13/2019   Zoster Vaccines- Shingrix (2 of 2) 12/06/2019    DEXA SCAN  Never done   COVID-19 Vaccine (6 - Booster for Pfizer series) 10/20/2021   HEMOGLOBIN A1C  03/22/2022   OPHTHALMOLOGY EXAM  09/26/2022   FOOT EXAM  09/28/2022   TETANUS/TDAP  02/06/2028   INFLUENZA VACCINE  Completed   HPV VACCINES  Aged Out    Health Maintenance  Health Maintenance Due  Topic Date Due   Pneumonia Vaccine 65 Years old (1 - PCV) Never done   HIV Screening  Never done   Hepatitis C Screening  Never done   PAP SMEAR-Modifier  Never done   COLONOSCOPY (Pts 45-413yrInsurance coverage will need to be confirmed)  Never done   MAMMOGRAM  Never done   COLON CANCER SCREENING ANNUAL FOBT  11/13/2019   Zoster Vaccines- Shingrix (2 of 2) 12/06/2019   DEXA SCAN  Never done    Colorectal cancer screening: Placed order for Cologuard on 09/28/2021.   Mammogram status: Ordered n/a. Pt provided with contact info and advised to call to schedule appt.  Patient denied mammogram.   Bone Density status: Ordered n/a. Pt provided with contact info and advised to call to schedule appt. Pt declined.  Lung Cancer Screening: (Low Dose CT Chest recommended if Age 854-80ears, 30 pack-year currently smoking OR have quit w/in 15years.) does not qualify.   Lung Cancer Screening Referral: n/a  Additional Screening:  Hepatitis C Screening: does qualify; Completed pt declined  Vision Screening: Recommended annual ophthalmology exams for early detection of glaucoma and other disorders of the eye. Is the patient up to date with their annual eye exam?  Yes  Who is the provider or what is the name of the office in which the patient attends annual eye exams? Dr. RiMarvel Planf pt is not established with a provider, would they like to be referred to a provider to establish care? No .   Dental Screening: Recommended annual dental exams for proper  oral hygiene  Community Resource Referral / Chronic Care Management: CRR required this visit?  No   CCM required this visit?  No       Plan:  -EKG obtained: NSR, rate 62 bpm, abnormal EKG (ST&T wave abnormality, consider anterolateral ischemia). No previous EKG to compare, patient reports had stress test 15-20 years ago at [facility]. Will request records, unable to locate in Care Everywhere. Will place order for echocardiogram.  -Discussed most recent labs which are essentially within normal limits or stable from prior. -Continue current medication regimen.  -Follow up in 4 months for DM, HTN, HLD  I have personally reviewed and noted the following in the patient's chart:   Medical and social history Use of alcohol, tobacco or illicit drugs  Current medications and supplements including opioid prescriptions. Patient is not currently taking opioid prescriptions. Functional ability and status Nutritional status Physical activity Advanced directives List of other physicians Hospitalizations, surgeries, and ER visits in previous 12 months Vitals Screenings to include cognitive, depression, and falls Referrals and appointments  In addition, I have reviewed and discussed with patient certain preventive protocols, quality metrics, and best practice recommendations. A written personalized care plan for preventive services as well as general preventive health recommendations were provided to patient.     Lorrene Reid, PA-C   09/28/2021

## 2021-10-02 ENCOUNTER — Encounter: Payer: Self-pay | Admitting: Physician Assistant

## 2021-10-06 ENCOUNTER — Ambulatory Visit (HOSPITAL_COMMUNITY): Payer: Medicare Other | Attending: Cardiology

## 2021-10-06 ENCOUNTER — Other Ambulatory Visit: Payer: Self-pay

## 2021-10-06 DIAGNOSIS — R9431 Abnormal electrocardiogram [ECG] [EKG]: Secondary | ICD-10-CM | POA: Diagnosis present

## 2021-10-06 LAB — ECHOCARDIOGRAM COMPLETE
Area-P 1/2: 3.31 cm2
S' Lateral: 3.6 cm

## 2021-10-19 LAB — COLOGUARD: COLOGUARD: NEGATIVE

## 2021-10-23 ENCOUNTER — Other Ambulatory Visit: Payer: Self-pay | Admitting: Physician Assistant

## 2021-10-23 DIAGNOSIS — E1140 Type 2 diabetes mellitus with diabetic neuropathy, unspecified: Secondary | ICD-10-CM

## 2021-12-06 ENCOUNTER — Other Ambulatory Visit: Payer: Self-pay | Admitting: Physician Assistant

## 2021-12-06 DIAGNOSIS — G4700 Insomnia, unspecified: Secondary | ICD-10-CM

## 2021-12-06 DIAGNOSIS — E1140 Type 2 diabetes mellitus with diabetic neuropathy, unspecified: Secondary | ICD-10-CM

## 2022-01-13 ENCOUNTER — Other Ambulatory Visit: Payer: Self-pay | Admitting: Physician Assistant

## 2022-01-13 DIAGNOSIS — G4700 Insomnia, unspecified: Secondary | ICD-10-CM

## 2022-01-13 DIAGNOSIS — E7800 Pure hypercholesterolemia, unspecified: Secondary | ICD-10-CM

## 2022-01-30 ENCOUNTER — Other Ambulatory Visit: Payer: Self-pay

## 2022-01-30 ENCOUNTER — Encounter: Payer: Self-pay | Admitting: Physician Assistant

## 2022-01-30 ENCOUNTER — Ambulatory Visit (INDEPENDENT_AMBULATORY_CARE_PROVIDER_SITE_OTHER): Payer: Medicare Other | Admitting: Physician Assistant

## 2022-01-30 VITALS — BP 130/78 | HR 70 | Temp 97.7°F | Ht 68.0 in | Wt 189.0 lb

## 2022-01-30 DIAGNOSIS — E1169 Type 2 diabetes mellitus with other specified complication: Secondary | ICD-10-CM

## 2022-01-30 DIAGNOSIS — G4700 Insomnia, unspecified: Secondary | ICD-10-CM | POA: Insufficient documentation

## 2022-01-30 DIAGNOSIS — E1140 Type 2 diabetes mellitus with diabetic neuropathy, unspecified: Secondary | ICD-10-CM

## 2022-01-30 DIAGNOSIS — I152 Hypertension secondary to endocrine disorders: Secondary | ICD-10-CM

## 2022-01-30 DIAGNOSIS — E1159 Type 2 diabetes mellitus with other circulatory complications: Secondary | ICD-10-CM | POA: Diagnosis not present

## 2022-01-30 DIAGNOSIS — E0500 Thyrotoxicosis with diffuse goiter without thyrotoxic crisis or storm: Secondary | ICD-10-CM | POA: Diagnosis not present

## 2022-01-30 DIAGNOSIS — E785 Hyperlipidemia, unspecified: Secondary | ICD-10-CM

## 2022-01-30 LAB — POCT GLYCOSYLATED HEMOGLOBIN (HGB A1C): Hemoglobin A1C: 5.4 % (ref 4.0–5.6)

## 2022-01-30 NOTE — Assessment & Plan Note (Signed)
-  A1c has improved from 5.7 to 5.4, recommend to continue with monitoring carbohydrate and glucose intake. Continue ambulatory glucose monitoring. Will continue to monitor.

## 2022-01-30 NOTE — Assessment & Plan Note (Signed)
-  Last lipid panel: HDL 50, LDL 146 -Patient has hx of statin intolerance, tolerating fenofibrate without issues. Will continue current medication regimen. -Continue low fat diet. -Will continue to monitor.

## 2022-01-30 NOTE — Assessment & Plan Note (Signed)
-  Reviewed note from 12/28/2021. Patient has a lab order to repeat thyroid labs to evaluate remission state after stopping medication therapy (Methimazole 5 mg), will schedule lab visit. Discussed some of her symptoms could be related to hyperthyroidism and pending labs should follow-up with endocrinology as scheduled.

## 2022-01-30 NOTE — Assessment & Plan Note (Signed)
-  BP mildly elevated on intake, BP repeated and improved, stable. -Continue current medication regimen. -Will repeat CMP for medication monitoring ar f/up visit. -Will continue to monitor.

## 2022-01-30 NOTE — Assessment & Plan Note (Signed)
-  Stable. -Continue current medication regimen.  -Will continue to monitor. 

## 2022-01-30 NOTE — Progress Notes (Signed)
Established patient visit   Patient: Susan Sandoval   DOB: 1956-05-21   66 y.o. Female  MRN: [id number] Visit Date: 01/30/2022  Chief Complaint  Patient presents with   Follow-up   Hyperlipidemia   Hypertension   Diabetes   Subjective    HPI  Patient presents for follow-up on diabetes mellitus, hypertension and hyperlipidemia. Patient reports her endocrinologist stopped methimazole. Reports feeling nervous and jittery, skin dryness and had some mouth sores which have resolved.   Diabetes: Pt denies increased urination or thirst. Pt reports stopped medication few weeks ago. No hypoglycemic events. Checking glucose at home. FBS range from 110-130.  HTN: Pt denies chest pain, palpitations, dizziness or leg swelling. Taking medication as directed without side effects. Checks BP at home few times/wk and readings range 117-130/60-62.   HLD: Pt taking medication as directed without issues.   Insomnia: Takes 0.5 tablet trazodone every other night which helps with sleep.   Medications: Outpatient Medications Prior to Visit  Medication Sig   ACCU-CHEK GUIDE test strip USE AS INSTRUCTED   Accu-Chek Softclix Lancets lancets Use to check blood sugars every morning fasting and 2 hours after largest meal   calcium carbonate (OS-CAL) 1250 (500 Ca) MG chewable tablet Chew 1 tablet by mouth daily.   clobetasol (TEMOVATE) 0.05 % external solution APPLY 1 APPLICATION        TOPICALLY TWO TIMES A DAY   cyclobenzaprine (FLEXERIL) 10 MG tablet Take 1 tablet (10 mg total) by mouth 3 (three) times daily as needed for muscle spasms.   enalapril (VASOTEC) 5 MG tablet Take 1 tablet (5 mg total) by mouth daily.   Fenofibrate 50 MG CAPS TAKE 1 CAPSULE DAILY   hydrochlorothiazide (HYDRODIURIL) 12.5 MG tablet Take 1 tablet (12.5 mg total) by mouth daily.   Lancets Misc. (ACCU-CHEK SOFTCLIX LANCET DEV) KIT Use to check blood sugars every morning fasting and 2 hours after largest meal   metFORMIN (GLUCOPHAGE)  500 MG tablet TAKE 1 TABLET TWICE DAILY  WITH MEALS   traZODone (DESYREL) 50 MG tablet TAKE 1/2 TO 1 TABLET AT    BEDTIME AS NEEDED FOR SLEEP   [DISCONTINUED] methimazole (TAPAZOLE) 5 MG tablet Take 5 mg by mouth daily.   No facility-administered medications prior to visit.    Review of Systems Review of Systems:  A fourteen system review of systems was performed and found to be positive as per HPI.     Objective    BP 130/78 (BP Location: Left Arm)    Pulse 70    Temp 97.7 F (36.5 C)    Ht 5' 8"  (1.727 m)    Wt 189 lb (85.7 kg)    SpO2 98%    BMI 28.74 kg/m  BP Readings from Last 3 Encounters:  01/30/22 130/78  09/28/21 139/85  06/28/21 132/80   Wt Readings from Last 3 Encounters:  01/30/22 189 lb (85.7 kg)  09/28/21 189 lb (85.7 kg)  06/28/21 185 lb 12.8 oz (84.3 kg)    Physical Exam  General:  Well Developed, well nourished, appropriate for stated age.  Neuro:  Alert and oriented,  extra-ocular muscles intact  HEENT:  Normocephalic, atraumatic, neck supple  Skin:  no gross rash, warm, pink. Cardiac:  RRR, S1 S2 Respiratory: CTA B/L  Vascular:  Ext warm, no cyanosis apprec.; cap RF less 2 sec. Psych:  No HI/SI, judgement and insight good, Euthymic mood. Full Affect.   Results for orders placed or performed in visit on 01/30/22  POCT glycosylated hemoglobin (Hb A1C)  Result Value Ref Range   Hemoglobin A1C 5.4 4.0 - 5.6 %   HbA1c POC (<> result, manual entry)     HbA1c, POC (prediabetic range)     HbA1c, POC (controlled diabetic range)      Assessment & Plan      Problem List Items Addressed This Visit       Cardiovascular and Mediastinum   Hypertension associated with type 2 diabetes mellitus ([place])    -BP mildly elevated on intake, BP repeated and improved, stable. -Continue current medication regimen. -Will repeat CMP for medication monitoring ar f/up visit. -Will continue to monitor.        Endocrine   Type 2 diabetes mellitus with diabetic  neuropathy, unspecified ([place]) - Primary    -A1c has improved from 5.7 to 5.4, recommend to continue with monitoring carbohydrate and glucose intake. Continue ambulatory glucose monitoring. Will continue to monitor.      Relevant Orders   POCT glycosylated hemoglobin (Hb A1C) (Completed)   Graves disease    -Reviewed note from 12/28/2021. Patient has a lab order to repeat thyroid labs to evaluate remission state after stopping medication therapy (Methimazole 5 mg), will schedule lab visit. Discussed some of her symptoms could be related to hyperthyroidism and pending labs should follow-up with endocrinology as scheduled.      Hyperlipidemia associated with type 2 diabetes mellitus ([facility])    -Last lipid panel: HDL 50, LDL 146 -Patient has hx of statin intolerance, tolerating fenofibrate without issues. Will continue current medication regimen. -Continue low fat diet. -Will continue to monitor.        Other   Insomnia    -Stable. -Continue current medication regimen. -Will continue to monitor.       Return in about 4 months (around 05/30/2022) for DM, HTN, HLD .        Lorrene Reid, PA-C  [facility] at [facility] [phone] (phone) [phone] (fax)  [place]

## 2022-01-30 NOTE — Patient Instructions (Signed)
Hyperthyroidism  Hyperthyroidism is when the thyroid gland is too active (overactive). The thyroid gland is a small gland located in the lower front part of the neck, just in front of the windpipe (trachea). This gland makes hormones that help control how the body uses food for energy (metabolism) as well as how the heart and brain function. These hormones also play a role in keeping your bones strong. When the thyroid is overactive, it produces toomuch of a hormone called thyroxine. What are the causes? This condition may be caused by: Graves' disease. This is a disorder in which the body's disease-fighting system (immune system) attacks the thyroid gland. This is the most common cause. Inflammation of the thyroid gland. A tumor in the thyroid gland. Use of certain medicines, including: Prescription thyroid hormone replacement. Herbal supplements that mimic thyroid hormones. Amiodarone therapy. Solid or fluid-filled lumps within your thyroid gland (thyroid nodules). Taking in a large amount of iodine from foods or medicines. What increases the risk? You are more likely to develop this condition if: You are female. You have a family history of thyroid conditions. You smoke tobacco. You use a medicine called lithium. You take medicines that affect the immune system (immunosuppressants). What are the signs or symptoms? Symptoms of this condition include: Nervousness. Inability to tolerate heat. Unexplained weight loss. Diarrhea. Change in the texture of hair or skin. Heart skipping beats or making extra beats. Rapid heart rate. Loss of menstruation. Shaky hands. Fatigue. Restlessness. Sleep problems. Enlarged thyroid gland or a lump in the thyroid (nodule). You may also have symptoms of Graves' disease, which may include: Protruding eyes. Dry eyes. Red or swollen eyes. Problems with vision. How is this diagnosed? This condition may be diagnosed based on: Your symptoms and  medical history. A physical exam. Blood tests. Thyroid ultrasound. This test involves using sound waves to produce images of the thyroid gland. A thyroid scan. A radioactive substance is injected into a vein, and images show how much iodine is present in the thyroid. Radioactive iodine uptake test (RAIU). A small amount of radioactive iodine is given by mouth to see how much iodine the thyroid absorbs after a certain amount of time. How is this treated? Treatment depends on the cause and severity of the condition. Treatment may include: Medicines to reduce the amount of thyroid hormone your body makes. Radioactive iodine treatment (radioiodine therapy). This involves swallowing a small dose of radioactive iodine, in capsule or liquid form, to kill thyroid cells. Surgery to remove part or all of your thyroid gland. You may need to take thyroid hormone replacement medicine for the rest of your life after thyroid surgery. Medicines to help manage your symptoms. Follow these instructions at home:  Take over-the-counter and prescription medicines only as told by your health care provider. Do not use any products that contain nicotine or tobacco, such as cigarettes and e-cigarettes. If you need help quitting, ask your health care provider. Follow any instructions from your health care provider about diet. You may be instructed to limit foods that contain iodine. Keep all follow-up visits as told by your health care provider. This is important. You will need to have blood tests regularly so that your health care provider can monitor your condition. Contact a health care provider if: Your symptoms do not get better with treatment. You have a fever. You are taking thyroid hormone replacement medicine and you: Have symptoms of depression. Feel like you are tired all the time. Gain weight. Get help right   away if: You have chest pain. You have decreased alertness or a change in your awareness. You  have abdominal pain. You feel dizzy. You have a rapid heartbeat. You have an irregular heartbeat. You have difficulty breathing. Summary The thyroid gland is a small gland located in the lower front part of the neck, just in front of the windpipe (trachea). Hyperthyroidism is when the thyroid gland is too active (overactive) and produces too much of a hormone called thyroxine. The most common cause is Graves' disease, a disorder in which your immune system attacks the thyroid gland. Hyperthyroidism can cause various symptoms, such as unexplained weight loss, nervousness, inability to tolerate heat, or changes in your heartbeat. Treatment may include medicine to reduce the amount of thyroid hormone your body makes, radioiodine therapy, surgery, or medicines to manage symptoms. This information is not intended to replace advice given to you by your health care provider. Make sure you discuss any questions you have with your healthcare provider. Document Revised: 08/11/2020 Document Reviewed: 08/11/2020 Elsevier Patient Education  2022 Elsevier Inc.  

## 2022-02-05 ENCOUNTER — Other Ambulatory Visit: Payer: Medicare Other

## 2022-02-05 ENCOUNTER — Other Ambulatory Visit: Payer: Self-pay

## 2022-02-05 DIAGNOSIS — E0500 Thyrotoxicosis with diffuse goiter without thyrotoxic crisis or storm: Secondary | ICD-10-CM

## 2022-02-05 NOTE — Progress Notes (Signed)
Labs per endo order. AS, CMA

## 2022-02-06 LAB — TSH: TSH: 0.406 u[IU]/mL — ABNORMAL LOW (ref 0.450–4.500)

## 2022-02-20 IMAGING — CR DG HAND COMPLETE 3+V*R*
3 series · 3 of 3 positions shown · non-contrast
Comparison: None.

CLINICAL DATA: Bilateral hand pain without known injury.

EXAM:
RIGHT HAND - COMPLETE 3+ VIEW

[x hand pa right]
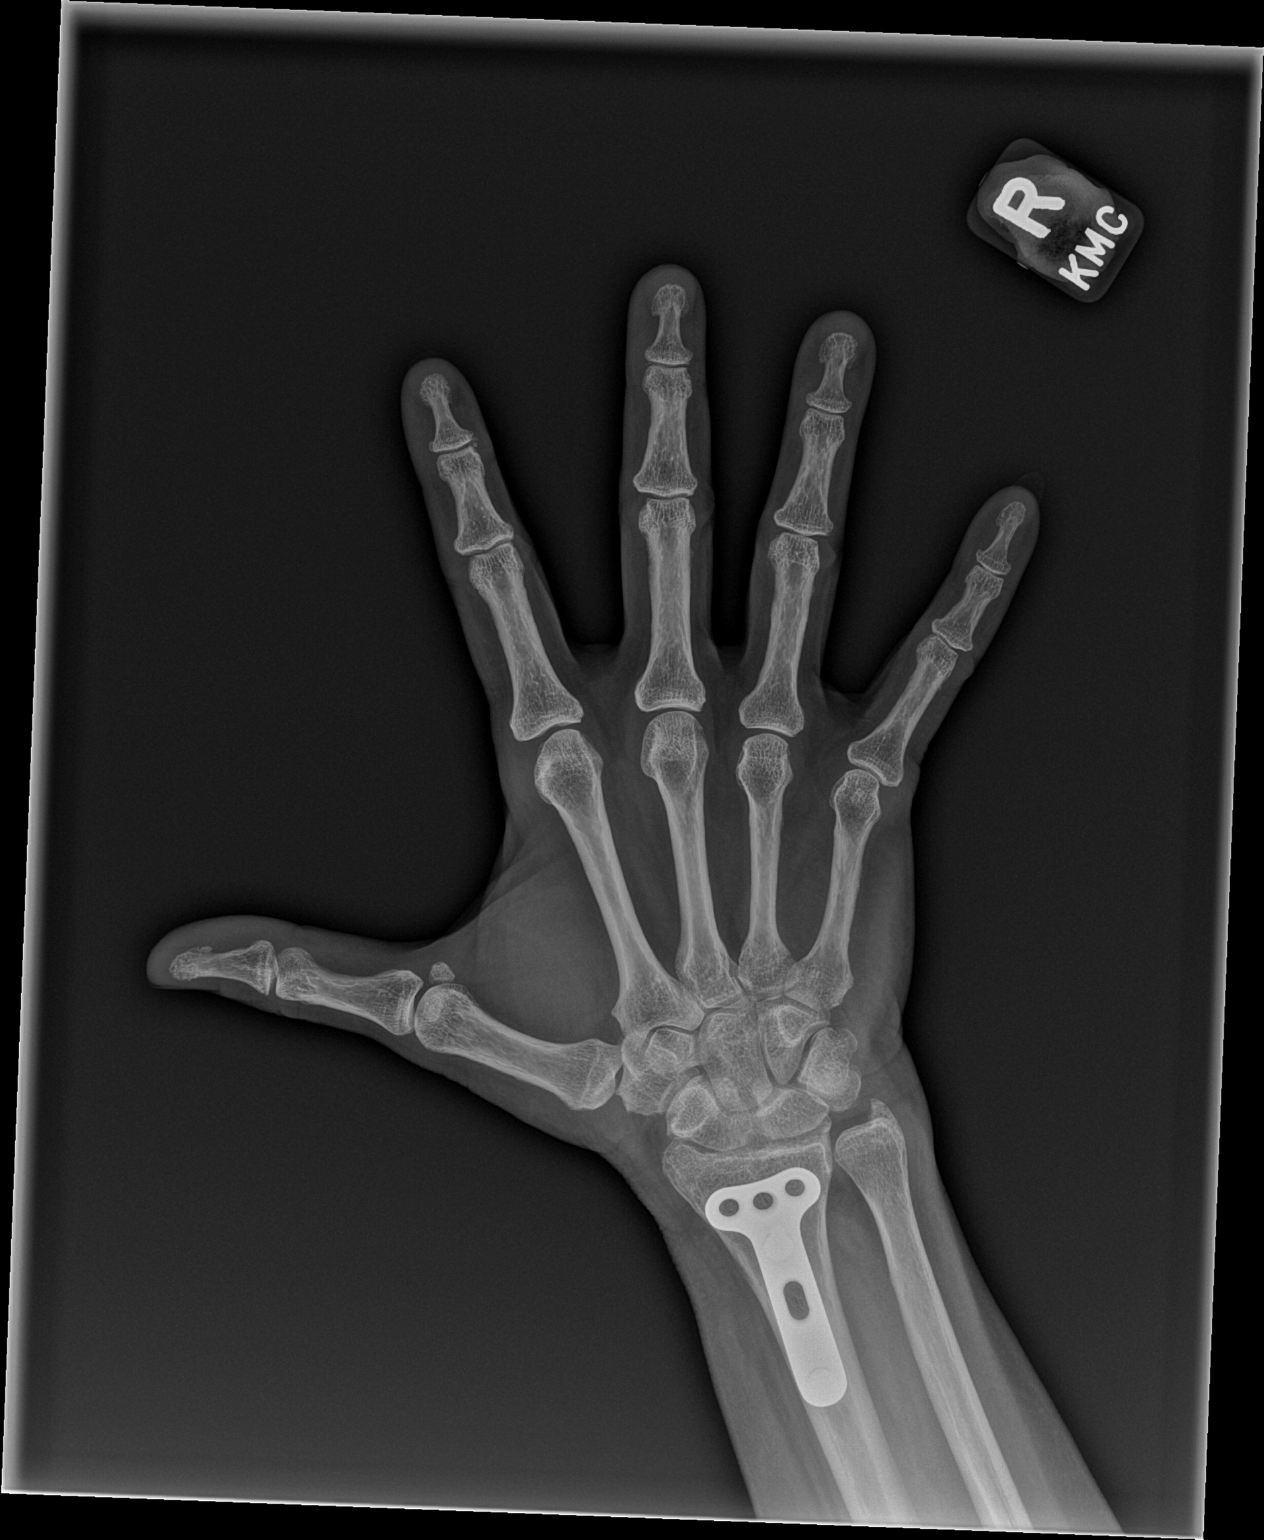

[x hand obl right]
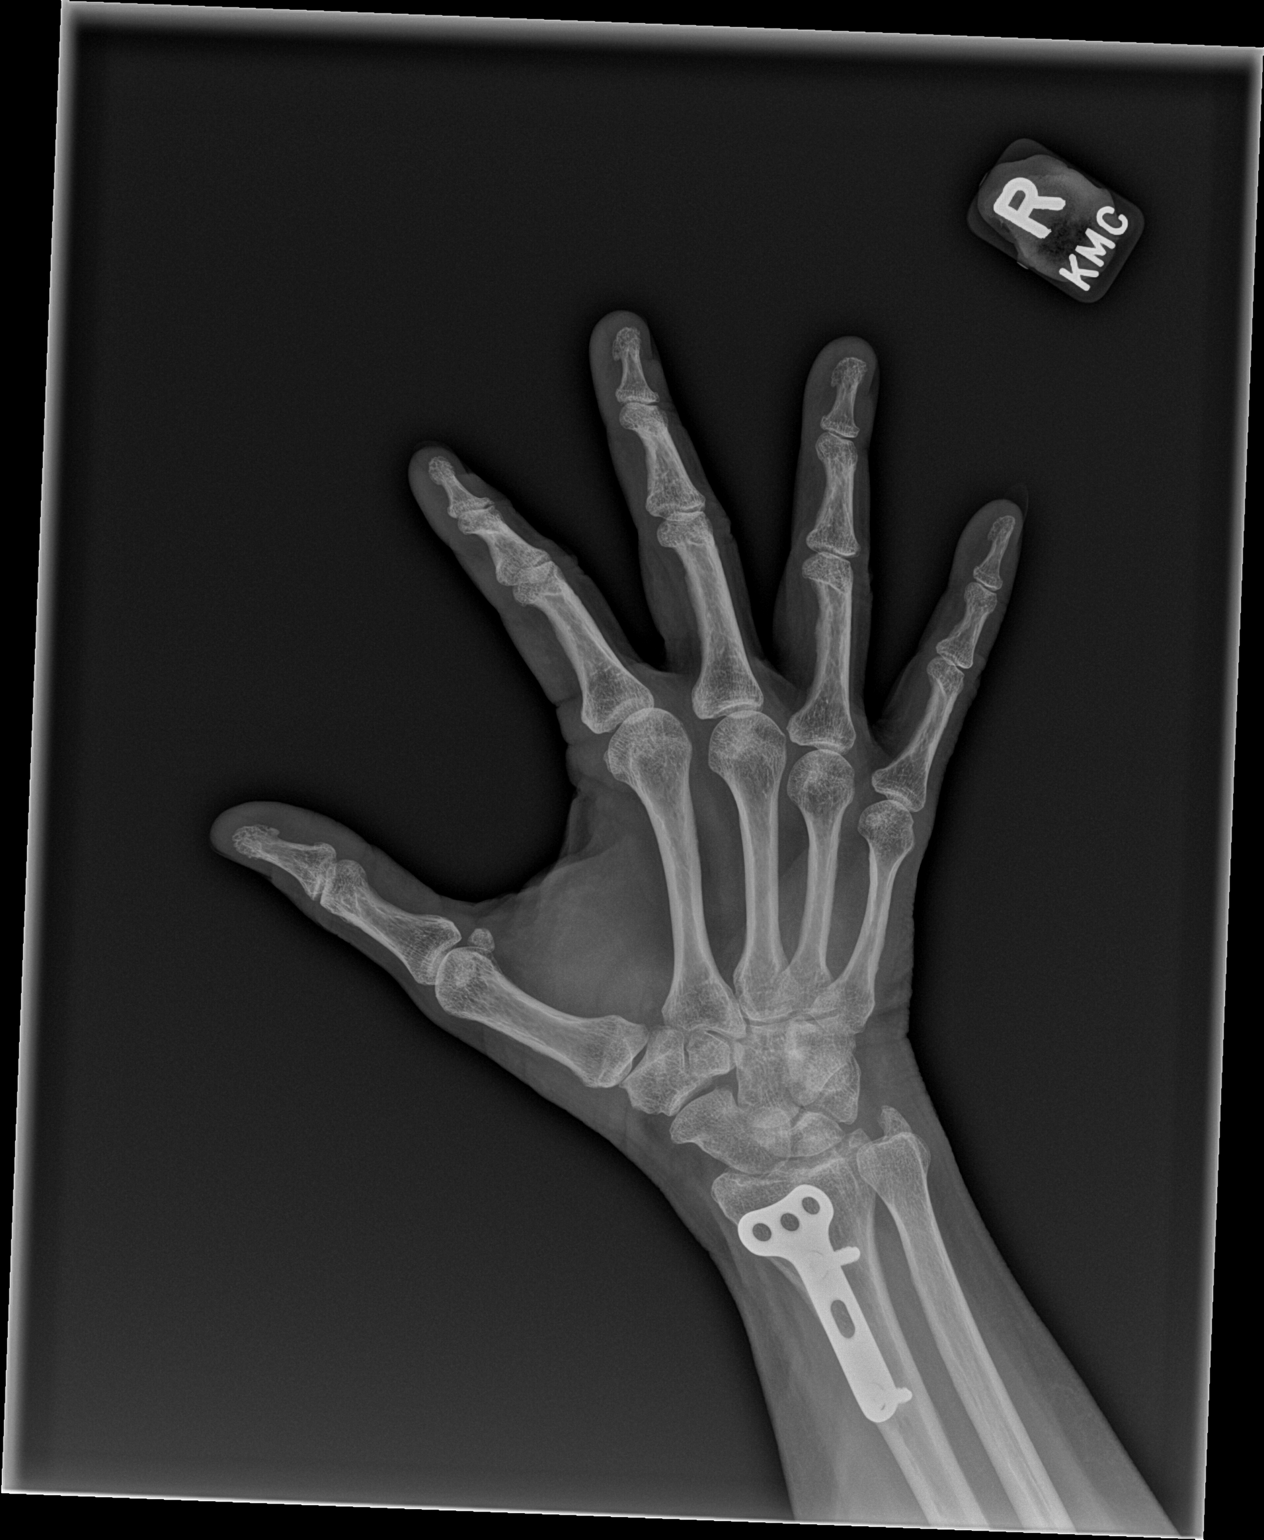

[x hand lat right]
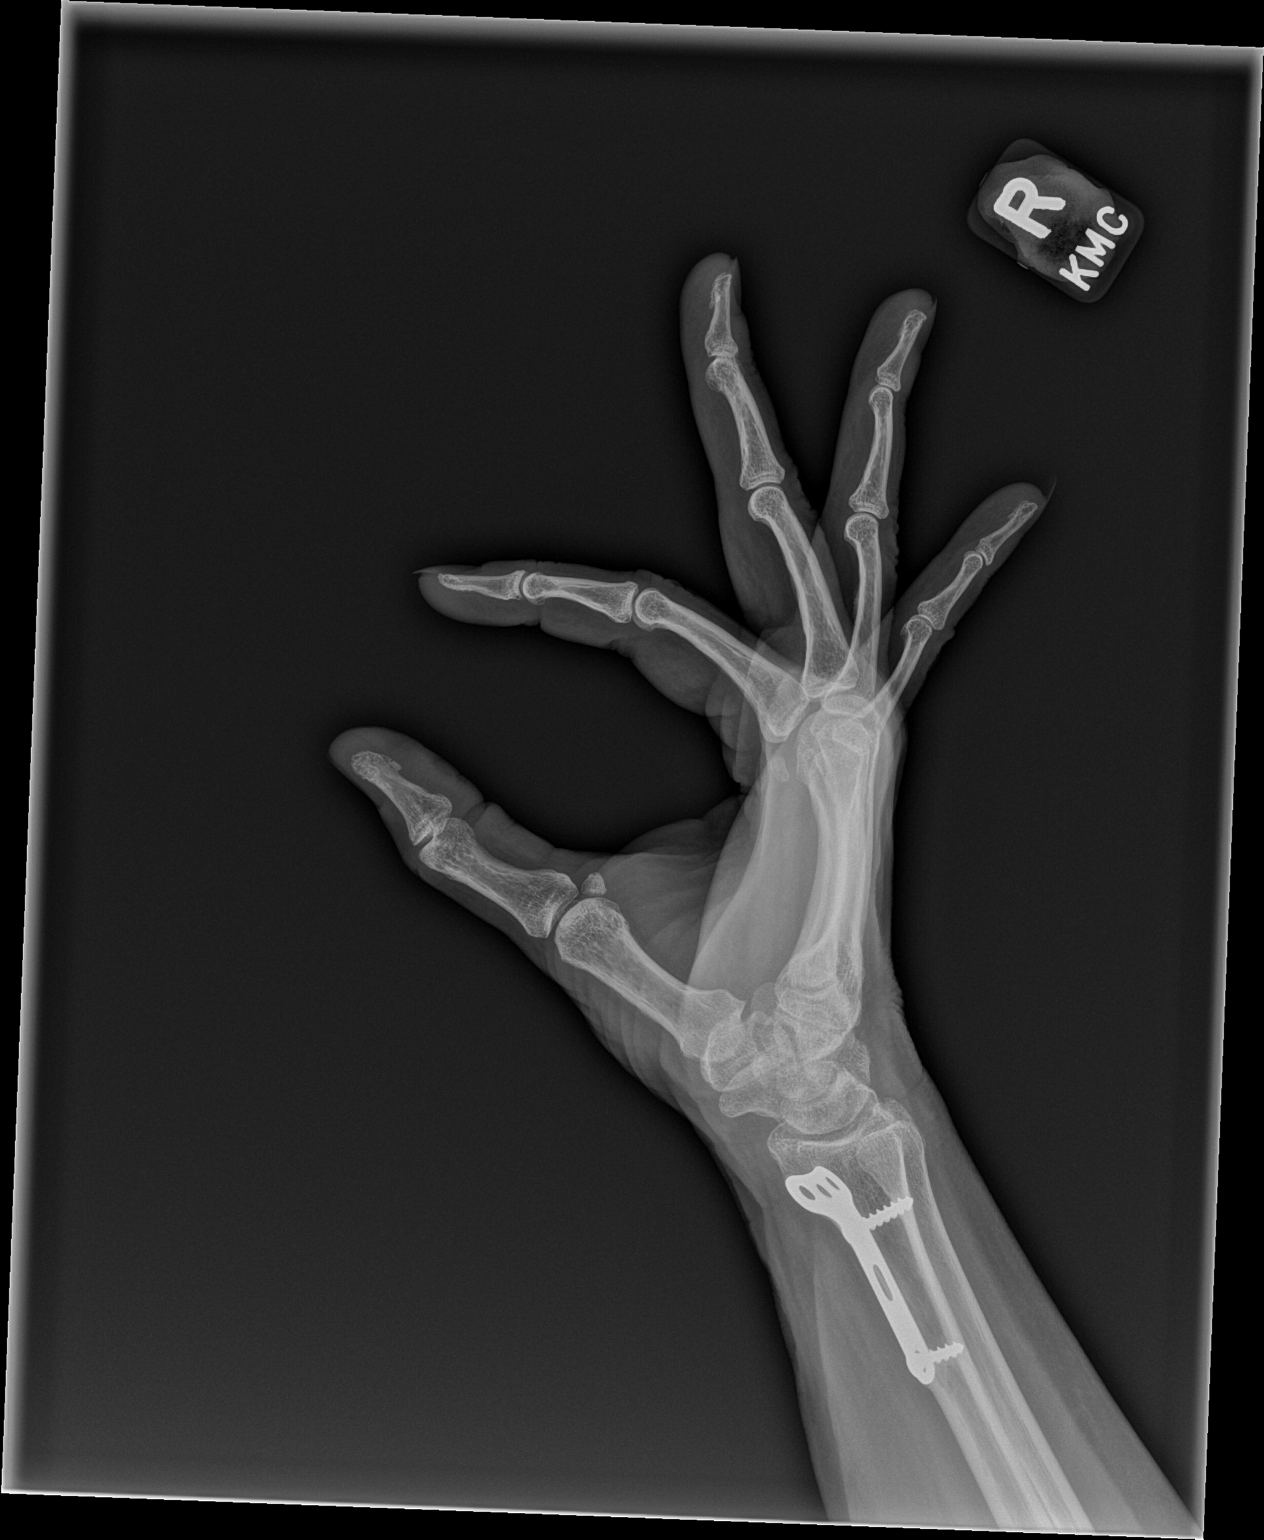

[3 of 3 positions shown; findings below may reference images not displayed]

FINDINGS: There is no evidence of acute fracture or dislocation. There is no
evidence of arthropathy. Status post surgical internal fixation of
old distal radial fracture. Soft tissues are unremarkable.
IMPRESSION: Old distal right radial fracture.  No other abnormality seen.

## 2022-02-26 ENCOUNTER — Encounter: Payer: Self-pay | Admitting: Physician Assistant

## 2022-03-08 ENCOUNTER — Other Ambulatory Visit: Payer: Self-pay | Admitting: Physician Assistant

## 2022-03-08 DIAGNOSIS — E7800 Pure hypercholesterolemia, unspecified: Secondary | ICD-10-CM

## 2022-03-08 DIAGNOSIS — I152 Hypertension secondary to endocrine disorders: Secondary | ICD-10-CM

## 2022-05-03 ENCOUNTER — Other Ambulatory Visit: Payer: Self-pay | Admitting: Physician Assistant

## 2022-05-03 DIAGNOSIS — I152 Hypertension secondary to endocrine disorders: Secondary | ICD-10-CM

## 2022-05-03 DIAGNOSIS — G4700 Insomnia, unspecified: Secondary | ICD-10-CM

## 2022-05-03 DIAGNOSIS — E7800 Pure hypercholesterolemia, unspecified: Secondary | ICD-10-CM

## 2022-05-25 NOTE — Patient Instructions (Incomplete)
Diabetes Mellitus and Foot Care Foot care is an important part of your health, especially when you have diabetes. Diabetes may cause you to have problems because of poor blood flow (circulation) to your feet and legs, which can cause your skin to: Become thinner and drier. Break more easily. Heal more slowly. Peel and crack. You may also have nerve damage (neuropathy) in your legs and feet, causing decreased feeling in them. This means that you may not notice minor injuries to your feet that could lead to more serious problems. Noticing and addressing any potential problems early is the best way to prevent future foot problems. How to care for your feet Foot hygiene  Wash your feet daily with warm water and mild soap. Do not use hot water. Then, pat your feet and the areas between your toes until they are completely dry. Do not soak your feet as this can dry your skin. Trim your toenails straight across. Do not dig under them or around the cuticle. File the edges of your nails with an emery board or nail file. Apply a moisturizing lotion or petroleum jelly to the skin on your feet and to dry, brittle toenails. Use lotion that does not contain alcohol and is unscented. Do not apply lotion between your toes. Shoes and socks Wear clean socks or stockings every day. Make sure they are not too tight. Do not wear knee-high stockings since they may decrease blood flow to your legs. Wear shoes that fit properly and have enough cushioning. Always look in your shoes before you put them on to be sure there are no objects inside. To break in new shoes, wear them for just a few hours a day. This prevents injuries on your feet. Wounds, scrapes, corns, and calluses  Check your feet daily for blisters, cuts, bruises, sores, and redness. If you cannot see the bottom of your feet, use a mirror or ask someone for help. Do not cut corns or calluses or try to remove them with medicine. If you find a minor scrape,  cut, or break in the skin on your feet, keep it and the skin around it clean and dry. You may clean these areas with mild soap and water. Do not clean the area with peroxide, alcohol, or iodine. If you have a wound, scrape, corn, or callus on your foot, look at it several times a day to make sure it is healing and not infected. Check for: Redness, swelling, or pain. Fluid or blood. Warmth. Pus or a bad smell. General tips Do not cross your legs. This may decrease blood flow to your feet. Do not use heating pads or hot water bottles on your feet. They may burn your skin. If you have lost feeling in your feet or legs, you may not know this is happening until it is too late. Protect your feet from hot and cold by wearing shoes, such as at the beach or on hot pavement. Schedule a complete foot exam at least once a year (annually) or more often if you have foot problems. Report any cuts, sores, or bruises to your health care provider immediately. Where to find more information American Diabetes Association: www.diabetes.org Association of Diabetes Care & Education Specialists: www.diabeteseducator.org Contact a health care provider if: You have a medical condition that increases your risk of infection and you have any cuts, sores, or bruises on your feet. You have an injury that is not healing. You have redness on your legs or feet. You   feel burning or tingling in your legs or feet. You have pain or cramps in your legs and feet. Your legs or feet are numb. Your feet always feel cold. You have pain around any toenails. Get help right away if: You have a wound, scrape, corn, or callus on your foot and: You have pain, swelling, or redness that gets worse. You have fluid or blood coming from the wound, scrape, corn, or callus. Your wound, scrape, corn, or callus feels warm to the touch. You have pus or a bad smell coming from the wound, scrape, corn, or callus. You have a fever. You have a red  line going up your leg. Summary Check your feet every day for blisters, cuts, bruises, sores, and redness. Apply a moisturizing lotion or petroleum jelly to the skin on your feet and to dry, brittle toenails. Wear shoes that fit properly and have enough cushioning. If you have foot problems, report any cuts, sores, or bruises to your health care provider immediately. Schedule a complete foot exam at least once a year (annually) or more often if you have foot problems. This information is not intended to replace advice given to you by your health care provider. Make sure you discuss any questions you have with your health care provider. Document Revised: 06/16/2020 Document Reviewed: 06/16/2020 Elsevier Patient Education  2023 Elsevier Inc.  

## 2022-06-01 ENCOUNTER — Ambulatory Visit (INDEPENDENT_AMBULATORY_CARE_PROVIDER_SITE_OTHER): Payer: Medicare Other | Admitting: Physician Assistant

## 2022-06-01 ENCOUNTER — Encounter: Payer: Self-pay | Admitting: Physician Assistant

## 2022-06-01 VITALS — BP 119/73 | HR 73 | Temp 97.1°F | Ht 68.11 in | Wt 185.1 lb

## 2022-06-01 DIAGNOSIS — E785 Hyperlipidemia, unspecified: Secondary | ICD-10-CM

## 2022-06-01 DIAGNOSIS — E0500 Thyrotoxicosis with diffuse goiter without thyrotoxic crisis or storm: Secondary | ICD-10-CM

## 2022-06-01 DIAGNOSIS — E1169 Type 2 diabetes mellitus with other specified complication: Secondary | ICD-10-CM

## 2022-06-01 DIAGNOSIS — E1159 Type 2 diabetes mellitus with other circulatory complications: Secondary | ICD-10-CM | POA: Diagnosis not present

## 2022-06-01 DIAGNOSIS — I152 Hypertension secondary to endocrine disorders: Secondary | ICD-10-CM

## 2022-06-01 DIAGNOSIS — G4700 Insomnia, unspecified: Secondary | ICD-10-CM

## 2022-06-01 DIAGNOSIS — E1140 Type 2 diabetes mellitus with diabetic neuropathy, unspecified: Secondary | ICD-10-CM | POA: Diagnosis not present

## 2022-06-01 LAB — POCT GLYCOSYLATED HEMOGLOBIN (HGB A1C): Hemoglobin A1C: 5.9 % — AB (ref 4.0–5.6)

## 2022-06-01 NOTE — Assessment & Plan Note (Signed)
-  Followed by endocrinology. Follow-up as scheduled.

## 2022-06-01 NOTE — Assessment & Plan Note (Signed)
-  Last lipid panel, LDL remains above goal. Patient reports willing to try a different statin, only tried one previously. Patient will send me a message via mcyhart to let me know which medication she tried and I will send for a different one and stop fenofibrate. Recommend repeating lipid panel and hepatic function in 6 weeks of starting new medication.

## 2022-06-01 NOTE — Progress Notes (Signed)
Established patient visit   Patient: Susan Sandoval   DOB: 02-13-1956   66 y.o. Female  MRN: [id number] Visit Date: 06/01/2022  Chief Complaint  Patient presents with   Follow-up   Diabetes   Subjective    HPI  Patient presents for chronic follow-up visit.   Diabetes: Pt denies increased urination or thirst. Pt reports medication compliance. Checking glucose at home once a while. States has not needed to take Metformin since her last visit.  HTN: Pt denies chest pain, palpitations, dizziness or lower swelling. Taking medication as directed without side effects. BP at home continues to do well, less than 140/90. Pt follows a low salt diet.  HLD: Pt taking medication as directed without issues. Reports tried a statin in [place] which she did not tolerate due to PVCs. Continues to follow a low fat diet.  Insomnia: Takes Trazodone 25 mg every other night which helps with sleep.   Grave's disease: Reports endocrinologist restarted methimazole. Has a follow-up visit next week.   Medications: Outpatient Medications Prior to Visit  Medication Sig   ACCU-CHEK GUIDE test strip USE AS INSTRUCTED   Accu-Chek Softclix Lancets lancets Use to check blood sugars every morning fasting and 2 hours after largest meal   calcium carbonate (OS-CAL) 1250 (500 Ca) MG chewable tablet Chew 1 tablet by mouth daily.   clobetasol (TEMOVATE) 0.05 % external solution APPLY 1 APPLICATION        TOPICALLY TWO TIMES A DAY   cyclobenzaprine (FLEXERIL) 10 MG tablet Take 1 tablet (10 mg total) by mouth 3 (three) times daily as needed for muscle spasms.   enalapril (VASOTEC) 5 MG tablet TAKE 1 TABLET DAILY   Fenofibrate 50 MG CAPS TAKE 1 CAPSULE DAILY   hydrochlorothiazide (HYDRODIURIL) 12.5 MG tablet TAKE 1 TABLET DAILY   Lancets Misc. (ACCU-CHEK SOFTCLIX LANCET DEV) KIT Use to check blood sugars every morning fasting and 2 hours after largest meal   metFORMIN (GLUCOPHAGE) 500 MG tablet TAKE 1 TABLET TWICE DAILY   WITH MEALS   traZODone (DESYREL) 50 MG tablet TAKE 1/2 TO 1 TABLET AT    BEDTIME AS NEEDED FOR SLEEP   No facility-administered medications prior to visit.    Review of Systems Review of Systems:  A fourteen system review of systems was performed and found to be positive as per HPI.     Last CBC Lab Results  Component Value Date   WBC 5.8 09/21/2021   HGB 14.2 09/21/2021   HCT 42.1 09/21/2021   MCV 92 09/21/2021   MCH 30.9 09/21/2021   RDW 12.7 09/21/2021   PLT 194 09/21/2021   Last metabolic panel Lab Results  Component Value Date   GLUCOSE 128 (H) 09/21/2021   NA 144 09/21/2021   K 3.8 09/21/2021   CL 106 09/21/2021   CO2 25 09/21/2021   BUN 18 09/21/2021   CREATININE 0.82 09/21/2021   EGFR 79 09/21/2021   CALCIUM 9.5 09/21/2021   PROT 6.5 09/21/2021   ALBUMIN 4.3 09/21/2021   LABGLOB 2.2 09/21/2021   AGRATIO 2.0 09/21/2021   BILITOT 0.4 09/21/2021   ALKPHOS 53 09/21/2021   AST 17 09/21/2021   ALT 14 09/21/2021   Last lipids Lab Results  Component Value Date   CHOL 213 (H) 09/21/2021   HDL 50 09/21/2021   LDLCALC 146 (H) 09/21/2021   TRIG 93 09/21/2021   CHOLHDL 4.3 09/21/2021   Last hemoglobin A1c Lab Results  Component Value Date   HGBA1C 5.4 01/30/2022  Last thyroid functions Lab Results  Component Value Date   TSH 0.406 (L) 02/05/2022   T3TOTAL 125 06/14/2020   Last vitamin D No results found for: "25OHVITD2", "25OHVITD3", "VD25OH"   Objective    BP 119/73   Pulse 73   Temp (!) 97.1 F (36.2 C)   Ht 5' 8.11" (1.73 m)   Wt 185 lb 1.9 oz (84 kg)   SpO2 96%   BMI 28.06 kg/m  BP Readings from Last 3 Encounters:  06/01/22 119/73  01/30/22 130/78  09/28/21 139/85   Wt Readings from Last 3 Encounters:  06/01/22 185 lb 1.9 oz (84 kg)  01/30/22 189 lb (85.7 kg)  09/28/21 189 lb (85.7 kg)    Physical Exam  General:  Well Developed, well nourished, appropriate for stated age.  Neuro:  Alert and oriented,  extra-ocular muscles  intact  HEENT:  Normocephalic, atraumatic, neck supple  Skin:  no gross rash, warm, pink. Cardiac:  RRR, S1 S2 Respiratory: CTA B/L  Vascular:  Ext warm, no cyanosis apprec.; cap RF less 2 sec. Psych:  No HI/SI, judgement and insight good, Euthymic mood. Full Affect.   No results found for any visits on 06/01/22.  Assessment & Plan      Problem List Items Addressed This Visit       Cardiovascular and Mediastinum   Hypertension associated with type 2 diabetes mellitus (HCC)    -Controlled. -Continue current medication regimen. -Will collect CMP at f/up visit. -Will continue to monitor.        Endocrine   Type 2 diabetes mellitus with diabetic neuropathy, unspecified (HCC) - Primary    -A1c mildly increased from 5.4 to 5.9 but continues to be at goal <7.0. Discussed with patient will continue with managing with diet. If fasting sugars seem to increase then recommend to resume Metformin. Will continue to monitor.      Relevant Orders   POCT glycosylated hemoglobin (Hb A1C)   Graves disease    -Followed by endocrinology. Follow-up as scheduled.      Hyperlipidemia associated with type 2 diabetes mellitus (HCC)    -Last lipid panel, LDL remains above goal. Patient reports willing to try a different statin, only tried one previously. Patient will send me a message via mcyhart to let me know which medication she tried and I will send for a different one and stop fenofibrate. Recommend repeating lipid panel and hepatic function in 6 weeks of starting new medication.        Insomnia: -Stable. -Continue current medication regimen. -Will continue to monitor.  Return in about 3 months (around 09/01/2022) for DM, HTN, HLD.        Mayer Masker, PA-C  [facility] at [facility] [phone] (phone) [phone] (fax)  [facility]

## 2022-06-05 ENCOUNTER — Other Ambulatory Visit: Payer: Self-pay | Admitting: Physician Assistant

## 2022-06-05 DIAGNOSIS — E1169 Type 2 diabetes mellitus with other specified complication: Secondary | ICD-10-CM

## 2022-06-05 MED ORDER — ROSUVASTATIN CALCIUM 5 MG PO TABS: 5.0000 mg | ORAL_TABLET | Freq: Every day | ORAL | 0 refills | Status: DC

## 2022-07-14 ENCOUNTER — Other Ambulatory Visit: Payer: Self-pay | Admitting: Physician Assistant

## 2022-07-14 DIAGNOSIS — I152 Hypertension secondary to endocrine disorders: Secondary | ICD-10-CM

## 2022-07-16 ENCOUNTER — Telehealth: Payer: Self-pay | Admitting: Physician Assistant

## 2022-07-16 NOTE — Telephone Encounter (Signed)
Patient aware and has appointment for fasting labs/visit with you

## 2022-07-16 NOTE — Telephone Encounter (Signed)
Patient states her cholesterol medication has her glucose elevated in the 120's-130's and she wants to know if she should stop taking the medication.

## 2022-07-17 ENCOUNTER — Other Ambulatory Visit: Payer: Medicare Other

## 2022-07-17 DIAGNOSIS — E1159 Type 2 diabetes mellitus with other circulatory complications: Secondary | ICD-10-CM

## 2022-07-17 DIAGNOSIS — E1169 Type 2 diabetes mellitus with other specified complication: Secondary | ICD-10-CM

## 2022-07-18 LAB — COMPREHENSIVE METABOLIC PANEL
ALT: 18 IU/L (ref 0–32)
AST: 17 IU/L (ref 0–40)
Albumin/Globulin Ratio: 1.9 (ref 1.2–2.2)
Albumin: 4.4 g/dL (ref 3.9–4.9)
Alkaline Phosphatase: 57 IU/L (ref 44–121)
BUN/Creatinine Ratio: 22 (ref 12–28)
BUN: 16 mg/dL (ref 8–27)
Bilirubin Total: 0.4 mg/dL (ref 0.0–1.2)
CO2: 22 mmol/L (ref 20–29)
Calcium: 9.6 mg/dL (ref 8.7–10.3)
Chloride: 105 mmol/L (ref 96–106)
Creatinine, Ser: 0.72 mg/dL (ref 0.57–1.00)
Globulin, Total: 2.3 g/dL (ref 1.5–4.5)
Glucose: 129 mg/dL — ABNORMAL HIGH (ref 70–99)
Potassium: 4 mmol/L (ref 3.5–5.2)
Sodium: 145 mmol/L — ABNORMAL HIGH (ref 134–144)
Total Protein: 6.7 g/dL (ref 6.0–8.5)
eGFR: 92 mL/min/{1.73_m2} (ref >59)

## 2022-07-18 LAB — LIPID PANEL
Chol/HDL Ratio: 2.6 ratio (ref 0.0–4.4)
Cholesterol, Total: 133 mg/dL (ref 100–199)
HDL: 52 mg/dL (ref >39)
LDL Chol Calc (NIH): 64 mg/dL (ref 0–99)
Triglycerides: 87 mg/dL (ref 0–149)
VLDL Cholesterol Cal: 17 mg/dL (ref 5–40)

## 2022-07-19 ENCOUNTER — Ambulatory Visit (INDEPENDENT_AMBULATORY_CARE_PROVIDER_SITE_OTHER): Payer: Medicare Other | Admitting: Physician Assistant

## 2022-07-19 ENCOUNTER — Encounter: Payer: Self-pay | Admitting: Physician Assistant

## 2022-07-19 VITALS — BP 127/76 | HR 67 | Temp 98.6°F | Wt 187.0 lb

## 2022-07-19 DIAGNOSIS — E1169 Type 2 diabetes mellitus with other specified complication: Secondary | ICD-10-CM

## 2022-07-19 DIAGNOSIS — E785 Hyperlipidemia, unspecified: Secondary | ICD-10-CM

## 2022-07-19 NOTE — Patient Instructions (Signed)
Cholesterol Medicines After viewing this video, you will be able to describe the indications, common side effects, and special considerations for cholesterol medicines after a heart attack or stroke. To view the content, go to this web address: [email or web address]  This video will expire on: 06/01/2024. If you need access to this video following this date, please reach out to the healthcare provider who assigned it to you. This information is not intended to replace advice given to you by your health care provider. Make sure you discuss any questions you have with your health care provider. Elsevier Patient Education  2023 [employer].

## 2022-07-19 NOTE — Progress Notes (Signed)
Established patient visit   Patient: Susan Sandoval   DOB: Jan 24, 1956   66 y.o. Female  MRN: [id number] Visit Date: 07/19/2022  Chief Complaint  Patient presents with   Follow-up   Subjective    HPI  Patient presents to discuss cholesterol medication. Patient was started on rosuvastatin 5 mg and reports has noticed her sugars being higher than normal since starting medication therapy. FBS have been 130-140s. Has been diligent with her diet and monitoring carbohydrates and glucose intake.    Medications: Outpatient Medications Prior to Visit  Medication Sig   ACCU-CHEK GUIDE test strip USE AS INSTRUCTED   Accu-Chek Softclix Lancets lancets Use to check blood sugars every morning fasting and 2 hours after largest meal   calcium carbonate (OS-CAL) 1250 (500 Ca) MG chewable tablet Chew 1 tablet by mouth daily.   clobetasol (TEMOVATE) 0.05 % external solution APPLY 1 APPLICATION        TOPICALLY TWO TIMES A DAY   CRESTOR 5 MG tablet TAKE 1 TABLET DAILY.   cyclobenzaprine (FLEXERIL) 10 MG tablet Take 1 tablet (10 mg total) by mouth 3 (three) times daily as needed for muscle spasms.   enalapril (VASOTEC) 5 MG tablet TAKE 1 TABLET DAILY   Fenofibrate 50 MG CAPS TAKE 1 CAPSULE DAILY   hydrochlorothiazide (HYDRODIURIL) 12.5 MG tablet TAKE 1 TABLET DAILY   Lancets Misc. (ACCU-CHEK SOFTCLIX LANCET DEV) KIT Use to check blood sugars every morning fasting and 2 hours after largest meal   metFORMIN (GLUCOPHAGE) 500 MG tablet TAKE 1 TABLET TWICE DAILY  WITH MEALS   traZODone (DESYREL) 50 MG tablet TAKE 1/2 TO 1 TABLET AT    BEDTIME AS NEEDED FOR SLEEP   No facility-administered medications prior to visit.    Review of Systems Review of Systems:  A fourteen system review of systems was performed and found to be positive as per HPI.  Last CBC Lab Results  Component Value Date   WBC 5.8 09/21/2021   HGB 14.2 09/21/2021   HCT 42.1 09/21/2021   MCV 92 09/21/2021   MCH 30.9 09/21/2021   RDW  12.7 09/21/2021   PLT 194 93/57/0177   Last metabolic panel Lab Results  Component Value Date   GLUCOSE 129 (H) 07/17/2022   NA 145 (H) 07/17/2022   K 4.0 07/17/2022   CL 105 07/17/2022   CO2 22 07/17/2022   BUN 16 07/17/2022   CREATININE 0.72 07/17/2022   EGFR 92 07/17/2022   CALCIUM 9.6 07/17/2022   PROT 6.7 07/17/2022   ALBUMIN 4.4 07/17/2022   LABGLOB 2.3 07/17/2022   AGRATIO 1.9 07/17/2022   BILITOT 0.4 07/17/2022   ALKPHOS 57 07/17/2022   AST 17 07/17/2022   ALT 18 07/17/2022   Last lipids Lab Results  Component Value Date   CHOL 133 07/17/2022   HDL 52 07/17/2022   LDLCALC 64 07/17/2022   TRIG 87 07/17/2022   CHOLHDL 2.6 07/17/2022   Last hemoglobin A1c Lab Results  Component Value Date   HGBA1C 5.9 (A) 06/01/2022   Last thyroid functions Lab Results  Component Value Date   TSH 0.406 (L) 02/05/2022   T3TOTAL 125 06/14/2020   Last vitamin D No results found for: "25OHVITD2", "25OHVITD3", "VD25OH"   Objective    BP 127/76   Pulse 67   Temp 98.6 F (37 C) (Temporal)   Wt 187 lb (84.8 kg)   BMI 28.34 kg/m  BP Readings from Last 3 Encounters:  07/19/22 127/76  06/01/22 119/73  01/30/22 130/78   Wt Readings from Last 3 Encounters:  07/19/22 187 lb (84.8 kg)  06/01/22 185 lb 1.9 oz (84 kg)  01/30/22 189 lb (85.7 kg)    Physical Exam  General:  Pleasant and cooperative, appropriate for stated age.  Neuro:  Alert and oriented,  extra-ocular muscles intact  HEENT:  Normocephalic, atraumatic, neck supple  Skin:  no gross rash, warm, pink. Respiratory: Speaking in full sentence, unlabored. Vascular:  Ext warm, no cyanosis apprec.; cap RF less 2 sec. Psych:  No HI/SI, judgement and insight good, Euthymic mood. Full Affect.   No results found for any visits on 07/19/22.  Assessment & Plan      Problem List Items Addressed This Visit       Endocrine   Hyperlipidemia associated with type 2 diabetes mellitus ([place]) - Primary   Discussed with  patient recent lipid panel significantly improved. Patient is agreeable to try rosuvastatin 2.5 mg daily x 3-4 weeks. Advised if sugars fail to return to baseline then we try taking rosuvastatin 2.5 mg every other day x 3 weeks and if no improvement, then we can resume fenofibrate. Pt verbalized understanding. Follow-up as scheduled.  Return if symptoms worsen or fail to improve/as scheduled.        Lorrene Reid, PA-C  [facility] at [facility] [phone] (phone) [phone] (fax)  [place]

## 2022-08-31 ENCOUNTER — Ambulatory Visit (INDEPENDENT_AMBULATORY_CARE_PROVIDER_SITE_OTHER): Payer: Medicare Other | Admitting: Physician Assistant

## 2022-08-31 ENCOUNTER — Encounter: Payer: Self-pay | Admitting: Physician Assistant

## 2022-08-31 VITALS — BP 115/68 | HR 79 | Temp 97.3°F | Ht 68.0 in | Wt 188.0 lb

## 2022-08-31 DIAGNOSIS — E1140 Type 2 diabetes mellitus with diabetic neuropathy, unspecified: Secondary | ICD-10-CM | POA: Diagnosis not present

## 2022-08-31 DIAGNOSIS — E1159 Type 2 diabetes mellitus with other circulatory complications: Secondary | ICD-10-CM | POA: Diagnosis not present

## 2022-08-31 DIAGNOSIS — E1169 Type 2 diabetes mellitus with other specified complication: Secondary | ICD-10-CM

## 2022-08-31 DIAGNOSIS — I152 Hypertension secondary to endocrine disorders: Secondary | ICD-10-CM

## 2022-08-31 DIAGNOSIS — E785 Hyperlipidemia, unspecified: Secondary | ICD-10-CM

## 2022-08-31 LAB — POCT GLYCOSYLATED HEMOGLOBIN (HGB A1C): Hemoglobin A1C: 6.1 % — AB (ref 4.0–5.6)

## 2022-08-31 NOTE — Progress Notes (Signed)
Established patient visit   Patient: Susan Sandoval   DOB: [birth year]   66 y.o. Female  MRN: [id number] Visit Date: 08/31/2022  Chief Complaint  Patient presents with   Follow-up   Subjective    HPI  Patient presents for chronic follow-up visit.  Diabetes: No increased urination or thirst. Pt takes Metformin intermittently as needed if she notices her sugars increasing from baseline. No hypoglycemic events. Checking glucose at home. FBS average 110s-120s.   HTN: Pt denies chest pain, palpitations, dizziness or lower extremity swelling. Taking medications (enalapril and HCTZ) as directed without side effects. Continues with low fat diet.   HLD: Pt reports stopped taking rosuvastatin due to having issues with flatulence. Resumed fenofibrate 50 mg. Reduced cheese intake as well.    Medications: Outpatient Medications Prior to Visit  Medication Sig Note   ACCU-CHEK GUIDE test strip USE AS INSTRUCTED    Accu-Chek Softclix Lancets lancets Use to check blood sugars every morning fasting and 2 hours after largest meal    calcium carbonate (OS-CAL) 1250 (500 Ca) MG chewable tablet Chew 1 tablet by mouth daily.    clobetasol (TEMOVATE) 0.05 % external solution APPLY 1 APPLICATION        TOPICALLY TWO TIMES A DAY    cyclobenzaprine (FLEXERIL) 10 MG tablet Take 1 tablet (10 mg total) by mouth 3 (three) times daily as needed for muscle spasms.    enalapril (VASOTEC) 5 MG tablet TAKE 1 TABLET DAILY    Fenofibrate 50 MG CAPS TAKE 1 CAPSULE DAILY    hydrochlorothiazide (HYDRODIURIL) 12.5 MG tablet TAKE 1 TABLET DAILY    Lancets Misc. (ACCU-CHEK SOFTCLIX LANCET DEV) KIT Use to check blood sugars every morning fasting and 2 hours after largest meal    metFORMIN (GLUCOPHAGE) 500 MG tablet TAKE 1 TABLET TWICE DAILY  WITH MEALS    traZODone (DESYREL) 50 MG tablet TAKE 1/2 TO 1 TABLET AT    BEDTIME AS NEEDED FOR SLEEP    [DISCONTINUED] CRESTOR 5 MG tablet TAKE 1 TABLET DAILY. 08/31/2022: intolerance    No facility-administered medications prior to visit.    Review of Systems Review of Systems:  A fourteen system review of systems was performed and found to be positive as per HPI.  Last CBC Lab Results  Component Value Date   WBC 5.8 09/21/2021   HGB 14.2 09/21/2021   HCT 42.1 09/21/2021   MCV 92 09/21/2021   MCH 30.9 09/21/2021   RDW 12.7 09/21/2021   PLT 194 30/08/2329   Last metabolic panel Lab Results  Component Value Date   GLUCOSE 129 (H) 07/17/2022   NA 145 (H) 07/17/2022   K 4.0 07/17/2022   CL 105 07/17/2022   CO2 22 07/17/2022   BUN 16 07/17/2022   CREATININE 0.72 07/17/2022   EGFR 92 07/17/2022   CALCIUM 9.6 07/17/2022   PROT 6.7 07/17/2022   ALBUMIN 4.4 07/17/2022   LABGLOB 2.3 07/17/2022   AGRATIO 1.9 07/17/2022   BILITOT 0.4 07/17/2022   ALKPHOS 57 07/17/2022   AST 17 07/17/2022   ALT 18 07/17/2022   Last lipids Lab Results  Component Value Date   CHOL 133 07/17/2022   HDL 52 07/17/2022   LDLCALC 64 07/17/2022   TRIG 87 07/17/2022   CHOLHDL 2.6 07/17/2022   Last hemoglobin A1c Lab Results  Component Value Date   HGBA1C 5.9 (A) 06/01/2022   Last thyroid functions Lab Results  Component Value Date   TSH 0.406 (L) 02/05/2022   T3TOTAL  125 06/14/2020   Last vitamin D No results found for: "25OHVITD2", "25OHVITD3", "VD25OH"     Objective    BP 115/68   Pulse 79   Temp (!) 97.3 F (36.3 C) (Temporal)   Ht 5' 8"  (1.727 m)   Wt 188 lb (85.3 kg)   BMI 28.59 kg/m  BP Readings from Last 3 Encounters:  08/31/22 115/68  07/19/22 127/76  06/01/22 119/73   Wt Readings from Last 3 Encounters:  08/31/22 188 lb (85.3 kg)  07/19/22 187 lb (84.8 kg)  06/01/22 185 lb 1.9 oz (84 kg)    Physical Exam  General: Pleasant and cooperative, in no acute distress, appropriate for stated age.  Neuro:  Alert and oriented,  extra-ocular muscles intact  HEENT:  Normocephalic, atraumatic, neck supple  Skin:  no gross rash, warm, pink. Cardiac:   RRR, S1 S2 Respiratory: CTA B/L  Vascular:  Ext warm, no cyanosis apprec.; no edema Psych:  No HI/SI, judgement and insight good, Euthymic mood. Full Affect.   No results found for any visits on 08/31/22.  Assessment & Plan      Problem List Items Addressed This Visit       Cardiovascular and Mediastinum   Hypertension associated with type 2 diabetes mellitus ([place])    -Controlled. Continue current medication regimen.        Endocrine   Type 2 diabetes mellitus with diabetic neuropathy, unspecified ([place]) - Primary    -A1c has increased from 5.9 to 6.1, encourage to continue with low carbohydrate and glucose diet. Recommend resuming Metformin 500 mg BID if FBS remain consistently >125s. Patient unable to provide urine sample today for microalbumin so will collect at f/up visit.       Hyperlipidemia associated with type 2 diabetes mellitus ([place])    -Patient unable to tolerate atorvastatin and rosuvastatin. Recommend to continue with fenofibrate 50 mg and repeating lipid panel at f/up visit. If LDL remains elevated then recommend to consider increasing fenofibrate. Continue with a heart healthy diet.         Return in about 4 months (around 12/31/2022) for DM, HTN, HLD and FBW (lipid panel, cmp, a1c and urine microalbumin).        Lorrene Reid, PA-C  [facility] at [facility] [phone] (phone) [phone] (fax)  [place]

## 2022-08-31 NOTE — Patient Instructions (Signed)

## 2022-08-31 NOTE — Addendum Note (Signed)
Addended by: Adelfa Koh on: 08/31/2022 11:54 AM   Modules accepted: Orders

## 2022-08-31 NOTE — Assessment & Plan Note (Signed)
-  A1c has increased from 5.9 to 6.1, encourage to continue with low carbohydrate and glucose diet. Recommend resuming Metformin 500 mg BID if FBS remain consistently >125s. Patient unable to provide urine sample today for microalbumin so will collect at f/up visit.

## 2022-08-31 NOTE — Assessment & Plan Note (Signed)
-  Patient unable to tolerate atorvastatin and rosuvastatin. Recommend to continue with fenofibrate 50 mg and repeating lipid panel at f/up visit. If LDL remains elevated then recommend to consider increasing fenofibrate. Continue with a heart healthy diet.

## 2022-08-31 NOTE — Assessment & Plan Note (Signed)
Controlled. Continue current medication regimen.

## 2022-10-16 ENCOUNTER — Other Ambulatory Visit: Payer: Self-pay | Admitting: Physician Assistant

## 2022-10-16 DIAGNOSIS — G4700 Insomnia, unspecified: Secondary | ICD-10-CM

## 2022-10-16 DIAGNOSIS — E1159 Type 2 diabetes mellitus with other circulatory complications: Secondary | ICD-10-CM

## 2022-10-16 DIAGNOSIS — E7800 Pure hypercholesterolemia, unspecified: Secondary | ICD-10-CM

## 2022-11-27 LAB — HM MAMMOGRAPHY

## 2022-11-29 ENCOUNTER — Encounter: Payer: Self-pay | Admitting: Nurse Practitioner

## 2022-12-05 ENCOUNTER — Encounter: Payer: Self-pay | Admitting: Nurse Practitioner

## 2022-12-30 NOTE — Progress Notes (Signed)
Established patient visit   Patient: Susan Sandoval   DOB: [birth year]   67 y.o. Female  MRN: [id number] Visit Date: 12/31/2022   Chief Complaint  Patient presents with   Follow-up   Diabetes   Hyperlipidemia   Hypertension   Subjective    HPI  Follow up DM2 -HgbA1c 6.3 today, up from 6.1 at last check.  -Due for urine  microalbumin which was normal today  -was unable to tolerate crestor. Had to go back to fenofibrate.  Hypertension  -due to  have dexascan  -she is not interested in this at this time.  --does take proper calcium along with multi-vitamin. She is somewhat active.  -right shoulder discomfort.  --most severe when reaching behind her and with pronation and supination of the right hand.   -she denies chest pain, chest pressure, or shortness of breath. She denies headaches or visual disturbances. She denies abdominal pain, nausea, vomiting, or changes in bowel or bladder habits.     Medications: Outpatient Medications Prior to Visit  Medication Sig   ACCU-CHEK GUIDE test strip USE AS INSTRUCTED   Accu-Chek Softclix Lancets lancets Use to check blood sugars every morning fasting and 2 hours after largest meal   calcium carbonate (OS-CAL) 1250 (500 Ca) MG chewable tablet Chew 1 tablet by mouth daily.   clobetasol (TEMOVATE) 0.05 % external solution APPLY 1 APPLICATION        TOPICALLY TWO TIMES A DAY   cyclobenzaprine (FLEXERIL) 10 MG tablet Take 1 tablet (10 mg total) by mouth 3 (three) times daily as needed for muscle spasms.   enalapril (VASOTEC) 5 MG tablet TAKE 1 TABLET DAILY   Fenofibrate 50 MG CAPS TAKE 1 CAPSULE DAILY   hydrochlorothiazide (HYDRODIURIL) 12.5 MG tablet TAKE 1 TABLET DAILY   Lancets Misc. (ACCU-CHEK SOFTCLIX LANCET DEV) KIT Use to check blood sugars every morning fasting and 2 hours after largest meal   metFORMIN (GLUCOPHAGE) 500 MG tablet TAKE 1 TABLET TWICE DAILY  WITH MEALS   methimazole (TAPAZOLE) 5 MG tablet Take 5 mg by mouth daily.    traZODone (DESYREL) 50 MG tablet TAKE 1/2 TO 1 TABLET AT    BEDTIME AS NEEDED FOR SLEEP   No facility-administered medications prior to visit.    Review of Systems  Constitutional:  Negative for activity change, appetite change, chills, fatigue and fever.  HENT:  Negative for congestion, postnasal drip, rhinorrhea, sinus pressure, sinus pain, sneezing and sore throat.   Eyes: Negative.   Respiratory:  Negative for cough, chest tightness, shortness of breath and wheezing.   Cardiovascular:  Negative for chest pain and palpitations.  Gastrointestinal:  Negative for abdominal pain, constipation, diarrhea, nausea and vomiting.  Endocrine: Negative for cold intolerance, heat intolerance, polydipsia and polyuria.       Blood sugars doing well    Genitourinary:  Negative for dyspareunia, dysuria, flank pain, frequency and urgency.  Musculoskeletal:  Positive for arthralgias and myalgias. Negative for back pain.       Right shoulder discomfort with decreased ROM. This is especially badk when reaching behind her. May consider physical therapy in the future, but does not want to do that right now.   Skin:  Negative for rash.  Allergic/Immunologic: Negative for environmental allergies.  Neurological:  Negative for dizziness, weakness and headaches.  Hematological:  Negative for adenopathy.  Psychiatric/Behavioral:  The patient is not nervous/anxious.     Last CBC Lab Results  Component Value Date   WBC 5.8 09/21/2021  HGB 14.2 09/21/2021   HCT 42.1 09/21/2021   MCV 92 09/21/2021   MCH 30.9 09/21/2021   RDW 12.7 09/21/2021   PLT 194 32/20/2542   Last metabolic panel Lab Results  Component Value Date   GLUCOSE 129 (H) 07/17/2022   NA 145 (H) 07/17/2022   K 4.0 07/17/2022   CL 105 07/17/2022   CO2 22 07/17/2022   BUN 16 07/17/2022   CREATININE 0.72 07/17/2022   EGFR 92 07/17/2022   CALCIUM 9.6 07/17/2022   PROT 6.7 07/17/2022   ALBUMIN 4.4 07/17/2022   LABGLOB 2.3 07/17/2022    AGRATIO 1.9 07/17/2022   BILITOT 0.4 07/17/2022   ALKPHOS 57 07/17/2022   AST 17 07/17/2022   ALT 18 07/17/2022   Last lipids Lab Results  Component Value Date   CHOL 133 07/17/2022   HDL 52 07/17/2022   LDLCALC 64 07/17/2022   TRIG 87 07/17/2022   CHOLHDL 2.6 07/17/2022   Last hemoglobin A1c Lab Results  Component Value Date   HGBA1C 6.3 12/31/2022   Last thyroid functions Lab Results  Component Value Date   TSH 0.406 (L) 02/05/2022   T3TOTAL 125 06/14/2020        Objective     Today's Vitals   12/31/22 0842  BP: (Abnormal) 148/81  Pulse: 72  Resp: 18  SpO2: 98%  Weight: 192 lb (87.1 kg)  Height: 5\' 8"  (1.727 m)   Body mass index is 29.19 kg/m.  BP Readings from Last 3 Encounters:  12/31/22 (Abnormal) 148/81  08/31/22 115/68  07/19/22 127/76    Wt Readings from Last 3 Encounters:  12/31/22 192 lb (87.1 kg)  08/31/22 188 lb (85.3 kg)  07/19/22 187 lb (84.8 kg)    Physical Exam Vitals and nursing note reviewed.  Constitutional:      Appearance: Normal appearance. She is well-developed.  HENT:     Head: Normocephalic and atraumatic.  Eyes:     Pupils: Pupils are equal, round, and reactive to light.  Cardiovascular:     Rate and Rhythm: Normal rate and regular rhythm.     Pulses: Normal pulses.     Heart sounds: Normal heart sounds.  Pulmonary:     Effort: Pulmonary effort is normal.     Breath sounds: Normal breath sounds.  Abdominal:     Palpations: Abdomen is soft.  Musculoskeletal:        General: Normal range of motion.     Cervical back: Normal range of motion and neck supple.  Lymphadenopathy:     Cervical: No cervical adenopathy.  Skin:    General: Skin is warm and dry.     Capillary Refill: Capillary refill takes less than 2 seconds.  Neurological:     General: No focal deficit present.     Mental Status: She is alert and oriented to person, place, and time.  Psychiatric:        Mood and Affect: Mood normal.        Behavior:  Behavior normal.        Thought Content: Thought content normal.        Judgment: Judgment normal.     Results for orders placed or performed in visit on 12/31/22  POCT UA - Microalbumin  Result Value Ref Range   Microalbumin Ur, POC 10 mg/L   Creatinine, POC 50 mg/dL   Albumin/Creatinine Ratio, Urine, POC <30   POCT HgB A1C  Result Value Ref Range   Hemoglobin A1C  HbA1c POC (<> result, manual entry) 6.3 4.0 - 5.6 %   HbA1c, POC (prediabetic range)     HbA1c, POC (controlled diabetic range)      Assessment & Plan    1. Type 2 diabetes mellitus without complication, without long-term current use of insulin (HCC) HgbA1c 6.3 today with normal microalbumin. Continue metformin as prescribed. Recheck HgbA1c in 3 months.  - POCT UA - Microalbumin - POCT HgB A1C  2. Hypertension associated with type 2 diabetes mellitus ([place]) Blood pressure slightly elevated today. Likely due to feeling anxious. No changes In medication at this time.   3. Hyperlipidemia associated with type 2 diabetes mellitus (HCC) Statin intolerance. Restarted on fenofibrate at last visit. Check fasting lipids prior to next visit and adjust dosing as indicated.   4. Right shoulder pain, unspecified chronicity Right shoulder discomfort, especially with reaching around the back. Patient may consider some physical therapy in the future. Will provide referral as indicated.     Problem List Items Addressed This Visit       Cardiovascular and Mediastinum   Hypertension associated with type 2 diabetes mellitus ([place])     Endocrine   Type 2 diabetes mellitus with diabetic neuropathy, unspecified ([place]) - Primary   Hyperlipidemia associated with type 2 diabetes mellitus ([place])     Other   Right shoulder pain     Return in about 3 months (around 04/01/2023) for medicare wellness, FBW a week prior to visit.         Ronnell Freshwater, NP  [facility] at [facility] [phone] (phone) [phone]  (fax)  [place]

## 2022-12-31 ENCOUNTER — Encounter: Payer: Self-pay | Admitting: Nurse Practitioner

## 2022-12-31 ENCOUNTER — Ambulatory Visit (INDEPENDENT_AMBULATORY_CARE_PROVIDER_SITE_OTHER): Payer: Medicare Other | Admitting: Nurse Practitioner

## 2022-12-31 VITALS — BP 144/79 | HR 72 | Resp 18 | Ht 68.0 in | Wt 192.0 lb

## 2022-12-31 DIAGNOSIS — E119 Type 2 diabetes mellitus without complications: Secondary | ICD-10-CM

## 2022-12-31 DIAGNOSIS — E1159 Type 2 diabetes mellitus with other circulatory complications: Secondary | ICD-10-CM | POA: Diagnosis not present

## 2022-12-31 DIAGNOSIS — E1169 Type 2 diabetes mellitus with other specified complication: Secondary | ICD-10-CM | POA: Diagnosis not present

## 2022-12-31 DIAGNOSIS — M25511 Pain in right shoulder: Secondary | ICD-10-CM | POA: Diagnosis not present

## 2022-12-31 DIAGNOSIS — I152 Hypertension secondary to endocrine disorders: Secondary | ICD-10-CM

## 2022-12-31 DIAGNOSIS — E785 Hyperlipidemia, unspecified: Secondary | ICD-10-CM

## 2022-12-31 LAB — POCT UA - MICROALBUMIN
Albumin/Creatinine Ratio, Urine, POC: <30
Creatinine, POC: 50 mg/dL
Microalbumin Ur, POC: 10 mg/L

## 2022-12-31 LAB — POCT GLYCOSYLATED HEMOGLOBIN (HGB A1C): HbA1c POC (<> result, manual entry): 6.3 % (ref 4.0–5.6)

## 2023-02-12 LAB — HM DIABETES EYE EXAM

## 2023-02-13 ENCOUNTER — Encounter: Payer: Self-pay | Admitting: Family Medicine

## 2023-03-06 ENCOUNTER — Telehealth: Payer: Self-pay | Admitting: *Deleted

## 2023-03-06 DIAGNOSIS — I152 Hypertension secondary to endocrine disorders: Secondary | ICD-10-CM

## 2023-03-06 DIAGNOSIS — E7800 Pure hypercholesterolemia, unspecified: Secondary | ICD-10-CM

## 2023-03-06 DIAGNOSIS — G4700 Insomnia, unspecified: Secondary | ICD-10-CM

## 2023-03-06 MED ORDER — ENALAPRIL MALEATE 5 MG PO TABS: 5.0000 mg | ORAL_TABLET | Freq: Every day | ORAL | 0 refills | Status: DC

## 2023-03-06 MED ORDER — FENOFIBRATE 50 MG PO CAPS: 1.0000 | ORAL_CAPSULE | Freq: Every day | ORAL | 0 refills | Status: DC

## 2023-03-06 MED ORDER — TRAZODONE HCL 50 MG PO TABS: ORAL_TABLET | ORAL | 0 refills | Status: DC

## 2023-03-06 MED ORDER — HYDROCHLOROTHIAZIDE 12.5 MG PO TABS: 12.5000 mg | ORAL_TABLET | Freq: Every day | ORAL | 0 refills | Status: DC

## 2023-03-06 NOTE — Telephone Encounter (Signed)
Refills sent

## 2023-03-06 NOTE — Addendum Note (Signed)
Addended by: Gemma Payor on: 03/06/2023 01:33 PM   Modules accepted: Orders

## 2023-03-06 NOTE — Telephone Encounter (Signed)
I also have not seen any communication from [facility], but I just sent in the prescriptions!

## 2023-03-06 NOTE — Telephone Encounter (Signed)
Pt calling needing refill on below.  She said that [facility] has reached out to [place] with no response. I did not see any communication from them.      traZODone (DESYREL) 50 MG tablet   hydrochlorothiazide (HYDRODIURIL) 12.5 MG tablet    Fenofibrate 50 MG CAPS    enalapril (VASOTEC) 5 MG tablet    [facility] [place] [place] [facility]   LOV 12/31/22  ROV 04/01/23

## 2023-03-06 NOTE — Telephone Encounter (Signed)
Pt informed of below.  

## 2023-03-06 NOTE — Telephone Encounter (Signed)
NOTE NOT NEEDED ?

## 2023-03-15 ENCOUNTER — Other Ambulatory Visit: Payer: Self-pay | Admitting: Family Medicine

## 2023-03-15 DIAGNOSIS — Z0000 Encounter for general adult medical examination without abnormal findings: Secondary | ICD-10-CM

## 2023-03-15 DIAGNOSIS — E1169 Type 2 diabetes mellitus with other specified complication: Secondary | ICD-10-CM

## 2023-03-15 DIAGNOSIS — E7800 Pure hypercholesterolemia, unspecified: Secondary | ICD-10-CM

## 2023-03-15 DIAGNOSIS — E119 Type 2 diabetes mellitus without complications: Secondary | ICD-10-CM

## 2023-03-15 DIAGNOSIS — E1159 Type 2 diabetes mellitus with other circulatory complications: Secondary | ICD-10-CM

## 2023-03-27 ENCOUNTER — Other Ambulatory Visit: Payer: Medicare Other

## 2023-03-27 DIAGNOSIS — E119 Type 2 diabetes mellitus without complications: Secondary | ICD-10-CM

## 2023-03-27 DIAGNOSIS — E1169 Type 2 diabetes mellitus with other specified complication: Secondary | ICD-10-CM

## 2023-03-27 DIAGNOSIS — E7800 Pure hypercholesterolemia, unspecified: Secondary | ICD-10-CM

## 2023-03-27 DIAGNOSIS — Z0000 Encounter for general adult medical examination without abnormal findings: Secondary | ICD-10-CM

## 2023-03-27 DIAGNOSIS — E1159 Type 2 diabetes mellitus with other circulatory complications: Secondary | ICD-10-CM

## 2023-03-28 LAB — COMPREHENSIVE METABOLIC PANEL
ALT: 20 IU/L (ref 0–32)
AST: 16 IU/L (ref 0–40)
Albumin/Globulin Ratio: 1.9 (ref 1.2–2.2)
Albumin: 4.4 g/dL (ref 3.9–4.9)
Alkaline Phosphatase: 54 IU/L (ref 44–121)
BUN/Creatinine Ratio: 20 (ref 12–28)
BUN: 14 mg/dL (ref 8–27)
Bilirubin Total: 0.3 mg/dL (ref 0.0–1.2)
CO2: 23 mmol/L (ref 20–29)
Calcium: 9.8 mg/dL (ref 8.7–10.3)
Chloride: 104 mmol/L (ref 96–106)
Creatinine, Ser: 0.69 mg/dL (ref 0.57–1.00)
Globulin, Total: 2.3 g/dL (ref 1.5–4.5)
Glucose: 127 mg/dL — ABNORMAL HIGH (ref 70–99)
Potassium: 3.5 mmol/L (ref 3.5–5.2)
Sodium: 143 mmol/L (ref 134–144)
Total Protein: 6.7 g/dL (ref 6.0–8.5)
eGFR: 96 mL/min/{1.73_m2} (ref >59)

## 2023-03-28 LAB — LIPID PANEL
Chol/HDL Ratio: 3.6 ratio (ref 0.0–4.4)
Cholesterol, Total: 200 mg/dL — ABNORMAL HIGH (ref 100–199)
HDL: 56 mg/dL (ref >39)
LDL Chol Calc (NIH): 129 mg/dL — ABNORMAL HIGH (ref 0–99)
Triglycerides: 84 mg/dL (ref 0–149)
VLDL Cholesterol Cal: 15 mg/dL (ref 5–40)

## 2023-03-28 LAB — CBC WITH DIFFERENTIAL/PLATELET
Basophils Absolute: 0 10*3/uL (ref 0.0–0.2)
Basos: 1 %
EOS (ABSOLUTE): 0.1 10*3/uL (ref 0.0–0.4)
Eos: 2 %
Hematocrit: 41.6 % (ref 34.0–46.6)
Hemoglobin: 14.8 g/dL (ref 11.1–15.9)
Immature Grans (Abs): 0 10*3/uL (ref 0.0–0.1)
Immature Granulocytes: 0 %
Lymphocytes Absolute: 1.5 10*3/uL (ref 0.7–3.1)
Lymphs: 27 %
MCH: 31.4 pg (ref 26.6–33.0)
MCHC: 35.6 g/dL (ref 31.5–35.7)
MCV: 88 fL (ref 79–97)
Monocytes Absolute: 0.4 10*3/uL (ref 0.1–0.9)
Monocytes: 7 %
Neutrophils Absolute: 3.5 10*3/uL (ref 1.4–7.0)
Neutrophils: 63 %
Platelets: 219 10*3/uL (ref 150–450)
RBC: 4.72 x10E6/uL (ref 3.77–5.28)
RDW: 13 % (ref 11.7–15.4)
WBC: 5.5 10*3/uL (ref 3.4–10.8)

## 2023-03-28 LAB — TSH: TSH: 1.13 u[IU]/mL (ref 0.450–4.500)

## 2023-03-28 LAB — HEMOGLOBIN A1C
Est. average glucose Bld gHb Est-mCnc: 120 mg/dL
Hgb A1c MFr Bld: 5.8 % — ABNORMAL HIGH (ref 4.8–5.6)

## 2023-03-29 ENCOUNTER — Telehealth: Payer: Self-pay | Admitting: *Deleted

## 2023-03-29 NOTE — Telephone Encounter (Signed)
LVM to call office to see if pt would like to change her AWV visit on Monday to be virtual and move it to Tuesday or Thursday with the AWV team.

## 2023-04-01 ENCOUNTER — Ambulatory Visit (INDEPENDENT_AMBULATORY_CARE_PROVIDER_SITE_OTHER): Payer: Medicare Other | Admitting: Family Medicine

## 2023-04-01 ENCOUNTER — Encounter: Payer: Self-pay | Admitting: Family Medicine

## 2023-04-01 VITALS — BP 115/75 | HR 67 | Resp 18 | Ht 68.0 in | Wt 192.0 lb

## 2023-04-01 DIAGNOSIS — L409 Psoriasis, unspecified: Secondary | ICD-10-CM

## 2023-04-01 DIAGNOSIS — Z780 Asymptomatic menopausal state: Secondary | ICD-10-CM

## 2023-04-01 DIAGNOSIS — E1169 Type 2 diabetes mellitus with other specified complication: Secondary | ICD-10-CM | POA: Diagnosis not present

## 2023-04-01 DIAGNOSIS — E1140 Type 2 diabetes mellitus with diabetic neuropathy, unspecified: Secondary | ICD-10-CM | POA: Diagnosis not present

## 2023-04-01 DIAGNOSIS — Z0000 Encounter for general adult medical examination without abnormal findings: Secondary | ICD-10-CM

## 2023-04-01 DIAGNOSIS — G8929 Other chronic pain: Secondary | ICD-10-CM

## 2023-04-01 DIAGNOSIS — R002 Palpitations: Secondary | ICD-10-CM

## 2023-04-01 DIAGNOSIS — M25511 Pain in right shoulder: Secondary | ICD-10-CM

## 2023-04-01 DIAGNOSIS — E785 Hyperlipidemia, unspecified: Secondary | ICD-10-CM

## 2023-04-01 MED ORDER — CLOBETASOL PROPIONATE 0.05 % EX SOLN
Freq: Two times a day (BID) | CUTANEOUS | 0 refills | Status: DC
Start: 2023-04-01 — End: 2023-07-01

## 2023-04-01 MED ORDER — ACCU-CHEK GUIDE VI STRP
ORAL_STRIP | 12 refills | Status: DC
Start: 2023-04-01 — End: 2024-07-31

## 2023-04-01 MED ORDER — ACCU-CHEK SOFTCLIX LANCETS MISC
12 refills | Status: DC
Start: 2023-04-01 — End: 2024-07-31

## 2023-04-01 MED ORDER — EZETIMIBE 10 MG PO TABS
10.0000 mg | ORAL_TABLET | Freq: Every day | ORAL | 3 refills | Status: DC
Start: 2023-04-01 — End: 2023-07-01

## 2023-04-01 MED ORDER — METFORMIN HCL 500 MG PO TABS: 500.0000 mg | ORAL_TABLET | Freq: Two times a day (BID) | ORAL | 1 refills | Status: DC

## 2023-04-01 NOTE — Assessment & Plan Note (Signed)
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  More recently, she has had right shoulder pain, decreased range of motion, and occasional shooting pain down her right arm.  This has been going on for about a year.  She has been doing stretches and exercises at home with no improvement.  Starting workup with an x-ray so that if orthopedics needs to do a CT or MRI they have the option to.  Sent referral to orthopedics, patient does not want to do physical therapy at this time as she does not will be helpful.

## 2023-04-01 NOTE — Patient Instructions (Signed)
When you go to the pharmacy, ask for the PCV 20 pneumonia vaccine.

## 2023-04-01 NOTE — Progress Notes (Signed)
Annual Wellness Visit     Patient: Susan Sandoval, Female    DOB: 1956-02-26, 67 y.o.   MRN: [id number]  Subjective  Chief Complaint  Patient presents with   Medicare Wellness    HPI Susan Sandoval is a 67 year old female presenting today for annual Medicare wellness visit.  Current concerns include right shoulder pain that has been going on for more than a year with decreased range of motion and occasional shooting pains.  She also has allergies this year and would like an over-the-counter allergy medication recommended.  Additionally, she has had episodes of her heart pounding and a sense of impending doom typically when she is rushing around doing things around the house like putting groceries away.  It does not happen when she is exercising.  Medications: Outpatient Medications Prior to Visit  Medication Sig   calcium carbonate (OS-CAL) 1250 (500 Ca) MG chewable tablet Chew 1 tablet by mouth daily.   cyclobenzaprine (FLEXERIL) 10 MG tablet Take 1 tablet (10 mg total) by mouth 3 (three) times daily as needed for muscle spasms.   enalapril (VASOTEC) 5 MG tablet Take 1 tablet (5 mg total) by mouth daily.   Fenofibrate 50 MG CAPS Take 1 capsule (50 mg total) by mouth daily.   hydrochlorothiazide (HYDRODIURIL) 12.5 MG tablet Take 1 tablet (12.5 mg total) by mouth daily.   Lancets Misc. (ACCU-CHEK SOFTCLIX LANCET DEV) KIT Use to check blood sugars every morning fasting and 2 hours after largest meal   methimazole (TAPAZOLE) 5 MG tablet Take 5 mg by mouth daily.   traZODone (DESYREL) 50 MG tablet TAKE 1/2 TO 1 TABLET AT    BEDTIME AS NEEDED FOR SLEEP   [DISCONTINUED] ACCU-CHEK GUIDE test strip USE AS INSTRUCTED   [DISCONTINUED] Accu-Chek Softclix Lancets lancets Use to check blood sugars every morning fasting and 2 hours after largest meal   [DISCONTINUED] clobetasol (TEMOVATE) 0.05 % external solution APPLY 1 APPLICATION        TOPICALLY TWO TIMES A DAY   [DISCONTINUED] metFORMIN  (GLUCOPHAGE) 500 MG tablet TAKE 1 TABLET TWICE DAILY  WITH MEALS   No facility-administered medications prior to visit.    Allergies  Allergen Reactions   Statins Palpitations    "crawling in legs"    Patient Care Team: Melida Quitter, PA as PCP - General (Family Medicine)  Review of Systems  Musculoskeletal:  Positive for joint pain.  All other systems reviewed and are negative.       Objective  BP 115/75 (BP Location: Left Arm, Patient Position: Sitting, Cuff Size: Normal)   Pulse 67   Resp 18   Ht 5\' 8"  (1.727 m)   Wt 192 lb (87.1 kg)   SpO2 96%   BMI 29.19 kg/m    Physical Exam Constitutional:      General: She is not in acute distress.    Appearance: Normal appearance.  HENT:     Head: Normocephalic and atraumatic.  Pulmonary:     Effort: Pulmonary effort is normal. No respiratory distress.  Musculoskeletal:     Cervical back: Normal range of motion.  Neurological:     General: No focal deficit present.     Mental Status: She is alert and oriented to person, place, and time. Mental status is at baseline.  Psychiatric:        Mood and Affect: Mood normal.        Thought Content: Thought content normal.        Judgment: Judgment  normal.    Most recent functional status assessment:    04/01/2023   10:22 AM  In your present state of health, do you have any difficulty performing the following activities:  Hearing? 0  Vision? 0  Difficulty concentrating or making decisions? 1  Comment remembering  Walking or climbing stairs? 0  Dressing or bathing? 0  Doing errands, shopping? 0  Preparing Food and eating ? N  Using the Toilet? N  In the past six months, have you accidently leaked urine? N  Do you have problems with loss of bowel control? N  Managing your Medications? N  Managing your Finances? N  Housekeeping or managing your Housekeeping? N   Most recent fall risk assessment:    04/01/2023   10:22 AM  Fall Risk   Falls in the past year? 0   Number falls in past yr: 0  Injury with Fall? 0  Risk for fall due to : No Fall Risks  Follow up Falls evaluation completed    Most recent depression screenings:    04/01/2023   10:16 AM 12/31/2022    8:43 AM  PHQ 2/9 Scores  PHQ - 2 Score 2 0  PHQ- 9 Score 6 2   Most recent cognitive screening:    04/01/2023   10:21 AM  6CIT Screen  What Year? 0 points  What month? 0 points  What time? 0 points  Count back from 20 0 points  Months in reverse 0 points  Repeat phrase 0 points  Total Score 0 points   Most recent Audit-C alcohol use screening    04/01/2023   10:16 AM  Alcohol Use Disorder Test (AUDIT)  1. How often do you have a drink containing alcohol? 1  2. How many drinks containing alcohol do you have on a typical day when you are drinking? 0  3. How often do you have six or more drinks on one occasion? 0  AUDIT-C Score 1   A score of 3 or more in women, and 4 or more in men indicates increased risk for alcohol abuse, EXCEPT if all of the points are from question 1   Vision/Hearing Screen: No results found. Assessment & Plan   Annual wellness visit done today including the all of the following: Reviewed patient's Family Medical History Reviewed and updated list of patient's medical providers Assessment of cognitive impairment was done Assessed patient's functional ability Established a written schedule for health screening services Health Risk Assessent Completed and Reviewed  Exercise Activities and Dietary recommendations  Goals   None     Immunization History  Administered Date(s) Administered   Fluad Quad(high Dose 65+) 09/28/2021   Influenza-Unspecified 09/10/2019, 09/14/2020, 08/29/2022   PFIZER(Purple Top)SARS-COV-2 Vaccination 02/23/2020, 03/15/2020, 09/14/2020, 03/16/2021, 08/25/2021   Pfizer Covid-19 Vaccine Bivalent Booster 5y-11y 08/29/2022   Tdap 02/05/2018   Zoster Recombinat (Shingrix) 10/11/2019    Health Maintenance  Topic Date Due    Hepatitis C Screening  Never done   Zoster Vaccines- Shingrix (2 of 2) 12/06/2019   Pneumonia Vaccine 16+ Years old (1 of 1 - PCV) Never done   FOOT EXAM  09/28/2022   COVID-19 Vaccine (7 - 2023-24 season) 10/24/2022   DEXA SCAN  01/01/2024 (Originally 06/03/2021)   INFLUENZA VACCINE  07/11/2023   HEMOGLOBIN A1C  09/26/2023   Diabetic kidney evaluation - Urine ACR  01/01/2024   OPHTHALMOLOGY EXAM  02/12/2024   Diabetic kidney evaluation - eGFR measurement  03/26/2024   Medicare Annual Wellness (AWV)  03/31/2024   Fecal DNA (Cologuard)  10/12/2024   MAMMOGRAM  11/27/2024   DTaP/Tdap/Td (2 - Td or Tdap) 02/06/2028   HPV VACCINES  Aged Out     Discussed health benefits of physical activity, and encouraged her to engage in regular exercise appropriate for her age and condition.    Problem List Items Addressed This Visit     Type 2 diabetes mellitus with diabetic neuropathy, unspecified    Last A1c 5.8.  Continue metformin 500 mg twice daily.  Will continue to monitor.      Relevant Medications   Accu-Chek Softclix Lancets lancets   glucose blood (ACCU-CHEK GUIDE) test strip   metFORMIN (GLUCOPHAGE) 500 MG tablet   Psoriasis   Relevant Medications   clobetasol (TEMOVATE) 0.05 % external solution   Hyperlipidemia associated with type 2 diabetes mellitus    Patient did not tolerate trial of statin, developed stomach pain and gas.  Patient agreeable to trial of Zetia 10 mg daily, continue fenofibrate 50 mg daily.  We discussed the importance of aiming to get LDL less than 70.  Will continue to monitor.      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  More recently, she has had right shoulder pain, decreased range of motion, and occasional shooting pain down her right arm.   This has been going on for about a year.  She has been doing stretches and exercises at home with no improvement.  Starting workup with an x-ray so that if orthopedics needs to do a CT or MRI they have the option to.  Sent referral to orthopedics, patient does not want to do physical therapy at this time as she does not will be helpful.       Relevant Orders   DG Shoulder Right   Ambulatory referral to Orthopedic Surgery   Other Visit Diagnoses     Encounter for Medicare annual wellness exam    -  Primary   Intermittent palpitations       Relevant Orders   Ambulatory referral to Cardiology   Postmenopausal estrogen deficiency       Relevant Orders   DG Bone Density       Return in 3 months (on 07/01/2023) for follow-up for DM, HLD (started Zetia), HTN; fasting blood work 1 week before.     Melida Quitter, PA     Subjective:   Susan Sandoval is a 67 y.o. female who presents for Medicare Annual (Subsequent) preventive examination.   Cardiac Risk Factors include: none     Objective:    Today's Vitals   04/01/23 1014 04/01/23 1019  BP: 115/75   Pulse: 67   Resp: 18   SpO2: 96%   Weight: 192 lb (87.1 kg)   Height: 5\' 8"  (1.727 m)   PainSc: 1  1   PainLoc: Shoulder    Body mass index is 29.19 kg/m.     04/01/2023   10:23 AM 12/09/2019    2:17 PM  Advanced Directives  Does Patient Have a Medical Advance Directive? No No  Would patient like information on creating a medical advance directive? No - Patient declined No - Patient declined    Current Medications (verified) Outpatient Encounter Medications as of 04/01/2023  Medication Sig  calcium carbonate (OS-CAL) 1250 (500 Ca) MG chewable tablet Chew 1 tablet by mouth daily.   cyclobenzaprine (FLEXERIL) 10 MG tablet Take 1 tablet (10 mg total) by mouth 3 (three) times daily as needed for muscle spasms.   enalapril (VASOTEC) 5 MG tablet Take 1 tablet (5 mg total) by mouth daily.   ezetimibe (ZETIA) 10 MG tablet  Take 1 tablet (10 mg total) by mouth daily.   Fenofibrate 50 MG CAPS Take 1 capsule (50 mg total) by mouth daily.   hydrochlorothiazide (HYDRODIURIL) 12.5 MG tablet Take 1 tablet (12.5 mg total) by mouth daily.   Lancets Misc. (ACCU-CHEK SOFTCLIX LANCET DEV) KIT Use to check blood sugars every morning fasting and 2 hours after largest meal   methimazole (TAPAZOLE) 5 MG tablet Take 5 mg by mouth daily.   traZODone (DESYREL) 50 MG tablet TAKE 1/2 TO 1 TABLET AT    BEDTIME AS NEEDED FOR SLEEP   [DISCONTINUED] ACCU-CHEK GUIDE test strip USE AS INSTRUCTED   [DISCONTINUED] Accu-Chek Softclix Lancets lancets Use to check blood sugars every morning fasting and 2 hours after largest meal   [DISCONTINUED] clobetasol (TEMOVATE) 0.05 % external solution APPLY 1 APPLICATION        TOPICALLY TWO TIMES A DAY   [DISCONTINUED] metFORMIN (GLUCOPHAGE) 500 MG tablet TAKE 1 TABLET TWICE DAILY  WITH MEALS   Accu-Chek Softclix Lancets lancets Use to check blood sugars every morning fasting and 2 hours after largest meal   clobetasol (TEMOVATE) 0.05 % external solution Apply topically 2 (two) times daily.   glucose blood (ACCU-CHEK GUIDE) test strip USE AS INSTRUCTED   metFORMIN (GLUCOPHAGE) 500 MG tablet Take 1 tablet (500 mg total) by mouth 2 (two) times daily with a meal.   No facility-administered encounter medications on file as of 04/01/2023.    Allergies (verified) Statins   History: Past Medical History:  Diagnosis Date   Diabetes mellitus without complication    Hyperlipidemia    Hypertension    Past Surgical History:  Procedure Laterality Date   CESAREAN SECTION     CHOLECYSTECTOMY     EYE SURGERY     childhood   SPINE SURGERY     discectomy/ laminectomy L4-L5   WRIST SURGERY Right    Family History  Problem Relation Age of Onset   Hypertension Mother    Alzheimer's disease Mother    Diabetes Father    Hypertension Father    Stroke Father    Diabetes Sister    Hypertension Sister     Hypertension Maternal Grandmother    Cancer Maternal Grandmother        intestinal    Alcohol abuse Maternal Grandfather    Stroke Maternal Grandfather    Stroke Paternal Grandmother    Hypertension Paternal Grandfather    Stroke Paternal Grandfather    Social History   Socioeconomic History   Marital status: Married    Spouse name: Not on file   Number of children: Not on file   Years of education: Not on file   Highest education level: Not on file  Occupational History   Not on file  Tobacco Use   Smoking status: Never    Passive exposure: Never   Smokeless tobacco: Never  Vaping Use   Vaping Use: Never used  Substance and Sexual Activity   Alcohol use: Yes    Alcohol/week: 2.0 standard drinks of alcohol    Types: 2 Glasses of wine per week   Drug use: Never  Sexual activity: Yes  Other Topics Concern   Not on file  Social History Narrative   Not on file   Social Determinants of Health   Financial Resource Strain: Low Risk  (04/01/2023)   Overall Financial Resource Strain (CARDIA)    Difficulty of Paying Living Expenses: Not hard at all  Food Insecurity: No Food Insecurity (04/01/2023)   Hunger Vital Sign    Worried About Running Out of Food in the Last Year: Never true    Ran Out of Food in the Last Year: Never true  Transportation Needs: No Transportation Needs (04/01/2023)   PRAPARE - Administrator, Civil Service (Medical): No    Lack of Transportation (Non-Medical): No  Physical Activity: Insufficiently Active (04/01/2023)   Exercise Vital Sign    Days of Exercise per Week: 4 days    Minutes of Exercise per Session: 30 min  Stress: No Stress Concern Present (04/01/2023)   [employer] of Occupational Health - Occupational Stress Questionnaire    Feeling of Stress : Only a little  Social Connections: Moderately Integrated (04/01/2023)   Social Connection and Isolation Panel [NHANES]    Frequency of Communication with Friends and Family: More  than three times a week    Frequency of Social Gatherings with Friends and Family: Twice a week    Attends Religious Services: Never    Database administrator or Organizations: Yes    Attends Engineer, structural: More than 4 times per year    Marital Status: Married    Tobacco Counseling Counseling given: Not Answered   Clinical Intake:  Pre-visit preparation completed: Yes  Pain : 0-10 Pain Score: 1  Pain Location: Shoulder Pain Orientation: Right Pain Radiating Towards: elbow Pain Descriptors / Indicators: Shooting, Aching, Tightness Pain Onset: More than a month ago Pain Frequency: Constant Pain Relieving Factors: stretching, water aerobics Effect of Pain on Daily Activities: decrease range of motion  Pain Relieving Factors: stretching, water aerobics  BMI - recorded: 29.19 Nutritional Status: BMI 25 -29 Overweight Nutritional Risks: None Diabetes: Yes CBG done?: No Did pt. bring in CBG monitor from home?: No  How often do you need to have someone help you when you read instructions, pamphlets, or other written materials from your doctor or pharmacy?: 1 - Never  Diabetic? Yes  Interpreter Needed?: No  Information entered by :: Susan Sandoval, RMA   Activities of Daily Living    04/01/2023   10:22 AM 08/31/2022   10:47 AM  In your present state of health, do you have any difficulty performing the following activities:  Hearing? 0 0  Vision? 0 0  Difficulty concentrating or making decisions? 1 0  Comment remembering   Walking or climbing stairs? 0 0  Dressing or bathing? 0 0  Doing errands, shopping? 0 0  Preparing Food and eating ? N   Using the Toilet? N   In the past six months, have you accidently leaked urine? N   Do you have problems with loss of bowel control? N   Managing your Medications? N   Managing your Finances? N   Housekeeping or managing your Housekeeping? N     Patient Care Team: Melida Quitter, PA as PCP - General  (Family Medicine)  Indicate any recent Medical Services you may have received from other than [facility] providers in the past year (date may be approximate).     Assessment:   This is a routine wellness examination for Susan Sandoval.  Hearing/Vision screen No results found.  Dietary issues and exercise activities discussed: Current Exercise Habits: Structured exercise class, Type of exercise: walking;Other - see comments (swimming), Time (Minutes): 30, Frequency (Times/Week): 4, Weekly Exercise (Minutes/Week): 120, Intensity: Moderate, Exercise limited by: None identified   Goals Addressed   None   Depression Screen    04/01/2023   10:16 AM 12/31/2022    8:43 AM 08/31/2022   10:47 AM 07/19/2022    2:50 PM 06/01/2022   10:22 AM 01/30/2022    9:41 AM 09/28/2021   10:46 AM  PHQ 2/9 Scores  PHQ - 2 Score 2 0 0  0 0 0  PHQ- 9 Score Exception Documentation    Patient refusal       Fall Risk    04/01/2023   10:22 AM 12/31/2022    8:44 AM 08/31/2022   10:46 AM 07/19/2022    2:48 PM 01/30/2022    9:41 AM  Fall Risk   Falls in the past year? 0 0 0 0 0  Number falls in past yr: 0 0 0 0 0  Injury with Fall? 0 0 0 0 0  Risk for fall due to : No Fall Risks  No Fall Risks No Fall Risks No Fall Risks  Follow up Falls evaluation completed  Falls evaluation completed Falls evaluation completed Falls evaluation completed    FALL RISK PREVENTION PERTAINING TO THE HOME:  Any stairs in or around the home? Yes  If so, are there any without handrails? No  Home free of loose throw rugs in walkways, pet beds, electrical cords, etc? Yes  Adequate lighting in your home to reduce risk of falls? Yes   ASSISTIVE DEVICES UTILIZED TO PREVENT FALLS:  Life alert? No  Use of a cane, walker or w/c? No  Grab bars in the bathroom? Yes  Shower chair or bench in shower? No  Elevated toilet seat or a handicapped toilet? No   TIMED UP AND GO:  Was the test performed? Yes .  Length of time to ambulate 10  feet: 10-15 sec.   Gait steady and fast without use of assistive device  Cognitive Function:        04/01/2023   10:21 AM 09/28/2021   10:37 AM  6CIT Screen  What Year? 0 points 0 points  What month? 0 points 0 points  What time? 0 points 0 points  Count back from 20 0 points 0 points  Months in reverse 0 points 0 points  Repeat phrase 0 points 0 points  Total Score 0 points 0 points    Immunizations Immunization History  Administered Date(s) Administered   Fluad Quad(high Dose 65+) 09/28/2021   Influenza-Unspecified 09/10/2019, 09/14/2020, 08/29/2022   PFIZER(Purple Top)SARS-COV-2 Vaccination 02/23/2020, 03/15/2020, 09/14/2020, 03/16/2021, 08/25/2021   Pfizer Covid-19 Vaccine Bivalent Booster 5y-11y 08/29/2022   Tdap 02/05/2018   Zoster Recombinat (Shingrix) 10/11/2019    TDAP status: Up to date  Flu Vaccine status: Up to date  Pneumococcal vaccine status: Due, Education has been provided regarding the importance of this vaccine. Advised may receive this vaccine at local pharmacy or Health Dept. Aware to provide a copy of the vaccination record if obtained from local pharmacy or Health Dept. Verbalized acceptance and understanding.  Covid-19 vaccine status: Information provided on how to obtain vaccines.   Qualifies for Shingles Vaccine? Yes   Zostavax completed No   Shingrix Completed?: No.    Education has been provided regarding  the importance of this vaccine. Patient has been advised to call insurance company to determine out of pocket expense if they have not yet received this vaccine. Advised may also receive vaccine at local pharmacy or Health Dept. Verbalized acceptance and understanding.  Screening Tests Health Maintenance  Topic Date Due   Hepatitis C Screening  Never done   Zoster Vaccines- Shingrix (2 of 2) 12/06/2019   Pneumonia Vaccine 4+ Years old (1 of 1 - PCV) Never done   FOOT EXAM  09/28/2022   COVID-19 Vaccine (7 - 2023-24 season) 10/24/2022    DEXA SCAN  01/01/2024 (Originally 06/03/2021)   INFLUENZA VACCINE  07/11/2023   HEMOGLOBIN A1C  09/26/2023   Diabetic kidney evaluation - Urine ACR  01/01/2024   OPHTHALMOLOGY EXAM  02/12/2024   Diabetic kidney evaluation - eGFR measurement  03/26/2024   Medicare Annual Wellness (AWV)  03/31/2024   Fecal DNA (Cologuard)  10/12/2024   MAMMOGRAM  11/27/2024   DTaP/Tdap/Td (2 - Td or Tdap) 02/06/2028   HPV VACCINES  Aged Out    Health Maintenance  Health Maintenance Due  Topic Date Due   Hepatitis C Screening  Never done   Zoster Vaccines- Shingrix (2 of 2) 12/06/2019   Pneumonia Vaccine 86+ Years old (1 of 1 - PCV) Never done   FOOT EXAM  09/28/2022   COVID-19 Vaccine (7 - 2023-24 season) 10/24/2022    Colorectal cancer screening: Type of screening: Cologuard. Completed 10/12/2021. Repeat every 3 years  Mammogram status: Completed 11/27/2022. Repeat every year  Bone Density status: Ordered 04/01/2023. Pt provided with contact info and advised to call to schedule appt.  Lung Cancer Screening: (Low Dose CT Chest recommended if Age 97-80 years, 30 pack-year currently smoking OR have quit w/in 15years.) does not qualify.    Additional Screening:  Hepatitis C Screening: does not qualify.  Vision Screening: Recommended annual ophthalmology exams for early detection of glaucoma and other disorders of the eye. Is the patient up to date with their annual eye exam?  Yes  Who is the provider or what is the name of the office in which the patient attends annual eye exams? [facility] If pt is not established with a provider, would they like to be referred to a provider to establish care?  Patient established .   Dental Screening: Recommended annual dental exams for proper oral hygiene  Community Resource Referral / Chronic Care Management: CRR required this visit?  No   CCM required this visit?  No      Plan:    Encounter for Medicare annual wellness  exam  Hyperlipidemia associated with type 2 diabetes mellitus Assessment & Plan: Patient did not tolerate trial of statin, developed stomach pain and gas.  Patient agreeable to trial of Zetia 10 mg daily, continue fenofibrate 50 mg daily.  We discussed the importance of aiming to get LDL less than 70.  Will continue to monitor.  Orders: -     Ezetimibe; Take 1 tablet (10 mg total) by mouth daily.  Dispense: 30 tablet; Refill: 3  Type 2 diabetes mellitus with diabetic neuropathy, unspecified whether long term insulin use Assessment & Plan: Last A1c 5.8.  Continue metformin 500 mg twice daily.  Will continue to monitor.  Orders: -     Accu-Chek Softclix Lancets; Use to check blood sugars every morning fasting and 2 hours after largest meal  Dispense: 100 each; Refill: 12  Type 2 diabetes mellitus with diabetic neuropathy, without long-term current use  of insulin Assessment & Plan: Last A1c 5.8.  Continue metformin 500 mg twice daily.  Will continue to monitor.  Orders: -     Accu-Chek Guide; USE AS INSTRUCTED  Dispense: 100 strip; Refill: 12 -     metFORMIN HCl; Take 1 tablet (500 mg total) by mouth 2 (two) times daily with a meal.  Dispense: 180 tablet; Refill: 1  Psoriasis -     Clobetasol Propionate; Apply topically 2 (two) times daily.  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  More recently, she has had right shoulder pain, decreased range of motion, and occasional shooting pain down her right arm.  This has been going on for about a year.  She has been doing stretches and exercises at home with no improvement.  Starting workup with an x-ray so that if orthopedics needs to do a CT or MRI they have the option to.  Sent referral to orthopedics, patient  does not want to do physical therapy at this time as she does not will be helpful.    Orders: -     DG Shoulder Right; Future -     Ambulatory referral to Orthopedic Surgery  Postmenopausal estrogen deficiency -     DG Bone Density; Future   I have personally reviewed and noted the following in the patient's chart:   Medical and social history Use of alcohol, tobacco or illicit drugs  Current medications and supplements including opioid prescriptions. Patient is not currently taking opioid prescriptions. Functional ability and status Nutritional status Physical activity Advanced directives List of other physicians Hospitalizations, surgeries, and ER visits in previous 12 months Vitals Screenings to include cognitive, depression, and falls Referrals and appointments  In addition, I have reviewed and discussed with patient certain preventive protocols, quality metrics, and best practice recommendations. A written personalized care plan for preventive services as well as general preventive health recommendations were provided to patient. Patient reports goal of continuing and completing hobbies.     Return in 3 months (on 07/01/2023) for follow-up for DM, HLD (started Zetia), HTN; fasting blood work 1 week before.  Melida Quitter, [place]   04/01/2023   Nurse Notes: face to face 25 minutes

## 2023-04-01 NOTE — Assessment & Plan Note (Signed)
Patient did not tolerate trial of statin, developed stomach pain and gas.  Patient agreeable to trial of Zetia 10 mg daily, continue fenofibrate 50 mg daily.  We discussed the importance of aiming to get LDL less than 70.  Will continue to monitor.

## 2023-04-01 NOTE — Telephone Encounter (Signed)
Pt completed this visit today.

## 2023-04-01 NOTE — Assessment & Plan Note (Signed)
Last A1c 5.8.  Continue metformin 500 mg twice daily.  Will continue to monitor.

## 2023-04-04 ENCOUNTER — Telehealth: Payer: Self-pay

## 2023-04-04 NOTE — Telephone Encounter (Signed)
Patient called office about her bone density order, patient states the  first available appointment is November, and she'd like to see if she could get her orders sent to [facility] that can get her scheduled today or tomorrow.  P: [phone] F: [phone]

## 2023-04-04 NOTE — Telephone Encounter (Signed)
Bone Density order faxed to [facility] per pts request.

## 2023-04-08 ENCOUNTER — Other Ambulatory Visit (INDEPENDENT_AMBULATORY_CARE_PROVIDER_SITE_OTHER): Payer: Medicare Other

## 2023-04-08 ENCOUNTER — Other Ambulatory Visit: Payer: Self-pay

## 2023-04-08 ENCOUNTER — Ambulatory Visit (INDEPENDENT_AMBULATORY_CARE_PROVIDER_SITE_OTHER): Payer: Medicare Other | Admitting: Orthopedic Surgery

## 2023-04-08 DIAGNOSIS — M7501 Adhesive capsulitis of right shoulder: Secondary | ICD-10-CM

## 2023-04-08 DIAGNOSIS — M25511 Pain in right shoulder: Secondary | ICD-10-CM | POA: Diagnosis not present

## 2023-04-08 NOTE — Progress Notes (Signed)
Office Visit Note   Patient: Susan Sandoval           Date of Birth: Apr 29, 1956           MRN: [id number] Visit Date: 04/08/2023 Requested by: Melida Quitter, PA [address] [place],  [place] [address] PCP: Melida Quitter, PA  Subjective: Chief Complaint  Patient presents with   Right Shoulder - Pain    HPI: Susan Sandoval is a 67 y.o. female who presents to the office reporting right shoulder pain 1 year duration.  Patient is diabetic.  Has not really tried over-the-counter medication.  Has a history of left frozen shoulder about 7 years ago where she worked it out on her own through swimming.  The current shoulder pain does feel to some degree like the left shoulder.  Radiates in the front of the arm down to the elbow.  Reaching up to put something into and out of a hide dryer is painful.  Pain does wake her from sleep at night.  Reports occasional neck pain but no scapular pain.  She has tried stretching and range of motion on her own without good relief.  She has good and bad days..                ROS: All systems reviewed are negative as they relate to the chief complaint within the history of present illness.  Patient denies fevers or chills.  Assessment & Plan: Visit Diagnoses:  1. Right shoulder pain, unspecified chronicity     Plan: Impression is right frozen shoulder.  Plan is glenohumeral injection today with 8-week return.  Stretching sheets provided.  She will follow-up with [place] in 8 weeks for clinical recheck.  Follow-Up Instructions: No follow-ups on file.   Orders:  Orders Placed This Encounter  Procedures   XR Shoulder Right   US Guided Needle Placement - No Linked Charges   No orders of the defined types were placed in this encounter.     Procedures: Large Joint Inj: R glenohumeral on 04/08/2023 11:26 AM Indications: diagnostic evaluation and pain Details: 18 G 1.5 in needle, ultrasound-guided posterior approach  Arthrogram:  No  Medications: 9 mL bupivacaine 0.5 %; 40 mg methylPREDNISolone acetate 40 MG/ML; 5 mL lidocaine 1 % Outcome: tolerated well, no immediate complications Procedure, treatment alternatives, risks and benefits explained, specific risks discussed. Consent was given by the patient. Immediately prior to procedure a time out was called to verify the correct patient, procedure, equipment, support staff and site/side marked as required. Patient was prepped and draped in the usual sterile fashion.       Clinical Data: No additional findings.  Objective: Vital Signs: There were no vitals taken for this visit.  Physical Exam:  Constitutional: Patient appears well-developed HEENT:  Head: Normocephalic Eyes:EOM are normal Neck: Normal range of motion Cardiovascular: Normal rate Pulmonary/chest: Effort normal Neurologic: Patient is alert Skin: Skin is warm Psychiatric: Patient has normal mood and affect  Ortho Exam: Ortho exam demonstrates range of motion on the right at 45/90/150 range of motion on the left of 70/110/175.  Rotator cuff strength is excellent in first May supraspinatus and subscap muscle testing on the right-hand side.  No coarse grinding or crepitus with passive range of motion on the right.  No discrete AC joint tenderness.  Cervical spine range of motion is full.  No paresthesias C5-T1.  Motor or sensory function of the hand is intact.  Specialty Comments:  No specialty comments available.  Imaging: XR Shoulder Right  Result Date: 04/08/2023 AP axillary outlet radiographs right shoulder reviewed.  No acute fracture.  No significant glenohumeral joint or AC joint arthritis.  Acromial distance maintained.  Visualized lung fields clear.  No dislocation.  US Guided Needle Placement - No Linked Charges  Result Date: 04/08/2023 Ultrasound imaging demonstrates needle placement into the right glenohumeral joint with extravasation of fluid and no complicating features    PMFS  History: Patient Active Problem List   Diagnosis Date Noted   Right shoulder pain 12/31/2022   Insomnia 01/30/2022   Hyperlipidemia associated with type 2 diabetes mellitus (HCC) 03/27/2021   Graves disease 06/16/2020   Psoriasis 05/21/2020   Hypertension associated with type 2 diabetes mellitus (HCC) 12/10/2019   GAD (generalized anxiety disorder) 12/10/2019   Type 2 diabetes mellitus with diabetic neuropathy, unspecified (HCC) 12/09/2019   Past Medical History:  Diagnosis Date   Diabetes mellitus without complication (HCC)    Hyperlipidemia    Hypertension     Family History  Problem Relation Age of Onset   Hypertension Mother    Alzheimer's disease Mother    Diabetes Father    Hypertension Father    Stroke Father    Diabetes Sister    Hypertension Sister    Hypertension Maternal Grandmother    Cancer Maternal Grandmother        intestinal    Alcohol abuse Maternal Grandfather    Stroke Maternal Grandfather    Stroke Paternal Grandmother    Hypertension Paternal Grandfather    Stroke Paternal Grandfather     Past Surgical History:  Procedure Laterality Date   CESAREAN SECTION     CHOLECYSTECTOMY     EYE SURGERY     childhood   SPINE SURGERY     discectomy/ laminectomy L4-L5   WRIST SURGERY Right    Social History   Occupational History   Not on file  Tobacco Use   Smoking status: Never    Passive exposure: Never   Smokeless tobacco: Never  Vaping Use   Vaping Use: Never used  Substance and Sexual Activity   Alcohol use: Yes    Alcohol/week: 2.0 standard drinks of alcohol    Types: 2 Glasses of wine per week   Drug use: Never   Sexual activity: Yes

## 2023-04-10 LAB — HM DEXA SCAN: HM Dexa Scan: NORMAL

## 2023-04-13 MED ORDER — BUPIVACAINE HCL 0.5 % IJ SOLN
9.0000 mL | INTRAMUSCULAR | Status: AC | PRN
Start: 2023-04-08 — End: 2023-04-08
  Administered 2023-04-08: 9 mL via INTRA_ARTICULAR

## 2023-04-13 MED ORDER — LIDOCAINE HCL 1 % IJ SOLN
5.0000 mL | INTRAMUSCULAR | Status: AC | PRN
Start: 2023-04-08 — End: 2023-04-08
  Administered 2023-04-08: 5 mL

## 2023-04-13 MED ORDER — METHYLPREDNISOLONE ACETATE 40 MG/ML IJ SUSP
40.0000 mg | INTRAMUSCULAR | Status: AC | PRN
Start: 2023-04-08 — End: 2023-04-08
  Administered 2023-04-08: 40 mg via INTRA_ARTICULAR

## 2023-05-07 NOTE — Progress Notes (Unsigned)
Cardiology Office Note:   Date:  05/09/2023  NAME:  Susan Sandoval    MRN: [id number] DOB:  07-Jul-1956   PCP:  Melida Quitter, PA  Cardiologist:  None  Electrophysiologist:  None   Referring MD: Melida Quitter, PA   Chief Complaint  Patient presents with   Palpitations         History of Present Illness:   Susan Sandoval is a 67 y.o. female with a hx of DM, HTN, HLD who is being seen today for the evaluation of palpitations at the request of Melida Quitter, [place].  She reports for the past month she was having episodes of palpitations.  She describes fluttering in her chest.  She reports it would occur every 3 to 4 days.  They were last several minutes and resolved.  She reports activity such as rushing from getting groceries or cooking would actually increase this.  She also reports stress increases.  She does activity which is water aerobics and walking around the neighborhood but has no symptoms of this when that happens.  She reports she feels a sensation to cough and symptoms improved.  She reports no chest pain or pressure.  EKG shows sinus rhythm with nonspecific ST-T changes.  She had an echo in 2022 that was normal.  Her CV examination is normal.  She reports has not had any symptoms in the past 3 to 4 weeks.  She reports things have calm down.  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  She has had no further recurrence or diagnosis.  She has not followed with cardiology.  She is diabetic but this is controlled with diet.  She consumes water and adequate amount.  She reports she does not smoke.  No excess alcohol.  No drug use.  She is a retired Paramedic.  She has 2 children.  She has 2 grandchildren.  Recent thyroid study is normal.  She does have hypothyroidism but this is controlled on methimazole.  She has never had a heart attack or stroke.  No strong family history of heart.  TSH is within limits recently.  She is not anemic.  All of her  lab work seems to be stable.  She does take Zetia for cholesterol.  She cannot take statins.  Problem List DM -A1c 5.8 HLD -T chol 200, HDL 56, LDL 129, TG 84 HTN Hyperthyroidism   Past Medical History: Past Medical History:  Diagnosis Date   Diabetes mellitus without complication (HCC)    Hyperlipidemia    Hypertension     Past Surgical History: Past Surgical History:  Procedure Laterality Date   CESAREAN SECTION     CHOLECYSTECTOMY     EYE SURGERY     childhood   SPINE SURGERY     discectomy/ laminectomy L4-L5   WRIST SURGERY Right     Current Medications: Current Meds  Medication Sig   Accu-Chek Softclix Lancets lancets Use to check blood sugars every morning fasting and 2 hours after largest meal   calcium carbonate (OS-CAL) 1250 (500 Ca) MG chewable tablet Chew 1 tablet by mouth daily.   clobetasol (TEMOVATE) 0.05 % external solution Apply topically 2 (two) times daily.   cyclobenzaprine (FLEXERIL) 10 MG tablet Take 1 tablet (10 mg total) by mouth 3 (three) times daily as needed for muscle spasms.   enalapril (VASOTEC) 5 MG tablet Take 1 tablet (5 mg total) by mouth daily.  ezetimibe (ZETIA) 10 MG tablet Take 1 tablet (10 mg total) by mouth daily.   Fenofibrate 50 MG CAPS Take 1 capsule (50 mg total) by mouth daily.   glucose blood (ACCU-CHEK GUIDE) test strip USE AS INSTRUCTED   hydrochlorothiazide (HYDRODIURIL) 12.5 MG tablet Take 1 tablet (12.5 mg total) by mouth daily.   Lancets Misc. (ACCU-CHEK SOFTCLIX LANCET DEV) KIT Use to check blood sugars every morning fasting and 2 hours after largest meal   metFORMIN (GLUCOPHAGE) 500 MG tablet Take 1 tablet (500 mg total) by mouth 2 (two) times daily with a meal.   methimazole (TAPAZOLE) 5 MG tablet Take 5 mg by mouth daily.   traZODone (DESYREL) 50 MG tablet TAKE 1/2 TO 1 TABLET AT    BEDTIME AS NEEDED FOR SLEEP     Allergies:    Statins   Social History: Social History   Socioeconomic History   Marital  status: Married    Spouse name: Not on file   Number of children: 2   Years of education: Not on file   Highest education level: Not on file  Occupational History   Occupation: Retired Dietitian  Tobacco Use   Smoking status: Never    Passive exposure: Never   Smokeless tobacco: Never  Vaping Use   Vaping Use: Never used  Substance and Sexual Activity   Alcohol use: Yes    Alcohol/week: 2.0 standard drinks of alcohol    Types: 2 Glasses of wine per week   Drug use: Never   Sexual activity: Yes  Other Topics Concern   Not on file  Social History Narrative   Not on file   Social Determinants of Health   Financial Resource Strain: Low Risk  (04/01/2023)   Overall Financial Resource Strain (CARDIA)    Difficulty of Paying Living Expenses: Not hard at all  Food Insecurity: No Food Insecurity (04/01/2023)   Hunger Vital Sign    Worried About Running Out of Food in the Last Year: Never true    Ran Out of Food in the Last Year: Never true  Transportation Needs: No Transportation Needs (04/01/2023)   PRAPARE - Administrator, Civil Service (Medical): No    Lack of Transportation (Non-Medical): No  Physical Activity: Insufficiently Active (04/01/2023)   Exercise Vital Sign    Days of Exercise per Week: 4 days    Minutes of Exercise per Session: 30 min  Stress: No Stress Concern Present (04/01/2023)   [employer] of Occupational Health - Occupational Stress Questionnaire    Feeling of Stress : Only a little  Social Connections: Moderately Integrated (04/01/2023)   Social Connection and Isolation Panel [NHANES]    Frequency of Communication with Friends and Family: More than three times a week    Frequency of Social Gatherings with Friends and Family: Twice a week    Attends Religious Services: Never    Database administrator or Organizations: Yes    Attends Engineer, structural: More than 4 times per year    Marital Status: Married     Family  History: The patient's family history includes Alcohol abuse in her maternal grandfather; Alzheimer's disease in her mother; Cancer in her maternal grandmother; Diabetes in her father and sister; Hypertension in her father, maternal grandmother, mother, paternal grandfather, and sister; Stroke in her father, maternal grandfather, paternal grandfather, and paternal grandmother.  ROS:   All other ROS reviewed and negative. Pertinent positives noted in the HPI.  EKGs/Labs/Other Studies Reviewed:   The following studies were personally reviewed by me today:  EKG:  EKG is ordered today.  The ekg ordered today demonstrates normal sinus rhythm heart rate 76, nonspecific ST-T changes, and was personally reviewed by me.   Recent Labs: 03/27/2023: ALT 20; BUN 14; Creatinine, Ser 0.69; Hemoglobin 14.8; Platelets 219; Potassium 3.5; Sodium 143; TSH 1.130   Recent Lipid Panel    Component Value Date/Time   CHOL 200 (H) 03/27/2023 0836   TRIG 84 03/27/2023 0836   HDL 56 03/27/2023 0836   CHOLHDL 3.6 03/27/2023 0836   LDLCALC 129 (H) 03/27/2023 0836    Physical Exam:   VS:  BP 124/80 (BP Location: Left Arm, Patient Position: Sitting, Cuff Size: Normal)   Pulse 78   Ht 5\' 8"  (1.727 m)   Wt 190 lb 3.2 oz (86.3 kg)   SpO2 99%   BMI 28.92 kg/m    Wt Readings from Last 3 Encounters:  05/09/23 190 lb 3.2 oz (86.3 kg)  04/01/23 192 lb (87.1 kg)  12/31/22 192 lb (87.1 kg)    General: Well nourished, well developed, in no acute distress Head: Atraumatic, normal size  Eyes: PEERLA, EOMI  Neck: Supple, no JVD Endocrine: No thryomegaly Cardiac: Normal S1, S2; RRR; no murmurs, rubs, or gallops Lungs: Clear to auscultation bilaterally, no wheezing, rhonchi or rales  Abd: Soft, nontender, no hepatomegaly  Ext: No edema, pulses 2+ Musculoskeletal: No deformities, BUE and BLE strength normal and equal Skin: Warm and dry, no rashes   Neuro: Alert and oriented to person, place, time, and situation,  CNII-XII grossly intact, no focal deficits  Psych: Normal mood and affect   ASSESSMENT:   ANJANETTA KERZNER is a 67 y.o. female who presents for the following: 1. Palpitations     PLAN:   1. Palpitations -Reports palpitations.  Now occurring infrequently.  Has not had any episodes in the past 3 to 4 weeks.  Does have a history of PVCs which she is could be.  Her thyroid is stable.  Blood pressure is stable.  All of her lab work is stable.  CV exam is normal.  EKG with nonspecific ST-T changes but her echocardiogram was normal in 2022.  We discussed that there is no need for a monitor at this point.  She is not having symptoms.  We discussed the Kardia mobile device.  She will look into this.  She can monitor her rhythm at home and if symptoms come back she can let us know.  I would not change any of her medications at this time.     Disposition: Return if symptoms worsen or fail to improve.  Medication Adjustments/Labs and Tests Ordered: Current medicines are reviewed at length with the patient today.  Concerns regarding medicines are outlined above.  Orders Placed This Encounter  Procedures   EKG 12-Lead   No orders of the defined types were placed in this encounter.   Patient Instructions  Medication Instructions:  The current medical regimen is effective;  continue present plan and medications.  *If you need a refill on your cardiac medications before your next appointment, please call your pharmacy*   Follow-Up: At [facility], you and your health needs are our priority.  As part of our continuing mission to provide you with exceptional heart care, we have created designated Provider Care Teams.  These Care Teams include your primary Cardiologist (physician) and Advanced Practice Providers (APPs -  Physician Assistants and Nurse  Practitioners) who all work together to provide you with the care you need, when you need it.  We recommend signing up for the patient portal  called "MyChart".  Sign up information is provided on this After Visit Summary.  MyChart is used to connect with patients for Virtual Visits (Telemedicine).  Patients are able to view lab/test results, encounter notes, upcoming appointments, etc.  Non-urgent messages can be sent to your provider as well.   To learn more about what you can do with MyChart, go to [email or web address].    Your next appointment:   As needed  Provider:   Lennie Odor, MD   Other Instructions   KardiaMobile [email or web address]        FDA-cleared, clinical grade mobile EKG monitor: Lourena Simmonds is the most clinically-validated mobile EKG used by the world's leading cardiac care medical professionals With Basic service, know instantly if your heart rhythm is normal or if atrial fibrillation is detected, and email the last single EKG recording to yourself or your doctor Premium service, available for purchase through the Kardia app for $9.99 per month or $99 per year, includes unlimited history and storage of your EKG recordings, a monthly EKG summary report to share with your doctor, along with the ability to track your blood pressure, activity and weight Includes one KardiaMobile phone clip FREE SHIPPING: Standard delivery 1-3 business days. Orders placed by 11:00am PST will ship that afternoon. Otherwise, will ship next business day. All orders ship via [employer] from [place], [place]    [employer] - sending an EKG Download app and set up profile. Run EKG - by placing 1-2 fingers on the silver plates After EKG is complete - Download PDF  - Skip password (if you apply a password the provider will need it to view the EKG) Click share button (square with upward arrow) in bottom left corner To send: choose MyChart (first time log into MyChart)  Pop up window about sending ECG Click continue Choose type of message Choose provider Type subject and message Click send (EKG  should be attached)  - To send additional EKGs in one message click the paperclip image and bottom of page to attach.        Signed, Lenna Gilford. Flora Lipps, MD, [facility]  [facility]  [address], [address] [place], [place] [address] [phone]  05/09/2023 2:29 PM

## 2023-05-09 ENCOUNTER — Encounter: Payer: Self-pay | Admitting: Cardiovascular Disease

## 2023-05-09 ENCOUNTER — Ambulatory Visit: Payer: Medicare Other | Attending: Cardiovascular Disease | Admitting: Cardiovascular Disease

## 2023-05-09 VITALS — BP 124/80 | HR 78 | Ht 68.0 in | Wt 190.2 lb

## 2023-05-09 DIAGNOSIS — R002 Palpitations: Secondary | ICD-10-CM

## 2023-05-09 NOTE — Patient Instructions (Signed)
Medication Instructions:  The current medical regimen is effective;  continue present plan and medications.  *If you need a refill on your cardiac medications before your next appointment, please call your pharmacy*   Follow-Up: At [facility], you and your health needs are our priority.  As part of our continuing mission to provide you with exceptional heart care, we have created designated Provider Care Teams.  These Care Teams include your primary Cardiologist (physician) and Advanced Practice Providers (APPs -  Physician Assistants and Nurse Practitioners) who all work together to provide you with the care you need, when you need it.  We recommend signing up for the patient portal called "MyChart".  Sign up information is provided on this After Visit Summary.  MyChart is used to connect with patients for Virtual Visits (Telemedicine).  Patients are able to view lab/test results, encounter notes, upcoming appointments, etc.  Non-urgent messages can be sent to your provider as well.   To learn more about what you can do with MyChart, go to [email or web address].    Your next appointment:   As needed  Provider:   Lennie Odor, MD   Other Instructions   KardiaMobile [email or web address]        FDA-cleared, clinical grade mobile EKG monitor: Susan Sandoval is the most clinically-validated mobile EKG used by the world's leading cardiac care medical professionals With Basic service, know instantly if your heart rhythm is normal or if atrial fibrillation is detected, and email the last single EKG recording to yourself or your doctor Premium service, available for purchase through the Kardia app for $9.99 per month or $99 per year, includes unlimited history and storage of your EKG recordings, a monthly EKG summary report to share with your doctor, along with the ability to track your blood pressure, activity and weight Includes one KardiaMobile phone clip  FREE SHIPPING: Standard delivery 1-3 business days. Orders placed by 11:00am PST will ship that afternoon. Otherwise, will ship next business day. All orders ship via [employer] from [place], [place]    [employer] - sending an EKG Download app and set up profile. Run EKG - by placing 1-2 fingers on the silver plates After EKG is complete - Download PDF  - Skip password (if you apply a password the provider will need it to view the EKG) Click share button (square with upward arrow) in bottom left corner To send: choose MyChart (first time log into MyChart)  Pop up window about sending ECG Click continue Choose type of message Choose provider Type subject and message Click send (EKG should be attached)  - To send additional EKGs in one message click the paperclip image and bottom of page to attach.

## 2023-05-16 ENCOUNTER — Other Ambulatory Visit: Payer: Self-pay | Admitting: Family Medicine

## 2023-05-16 DIAGNOSIS — E1159 Type 2 diabetes mellitus with other circulatory complications: Secondary | ICD-10-CM

## 2023-05-16 DIAGNOSIS — G4700 Insomnia, unspecified: Secondary | ICD-10-CM

## 2023-06-03 ENCOUNTER — Encounter: Payer: Self-pay | Admitting: Orthopedic Surgery

## 2023-06-03 ENCOUNTER — Ambulatory Visit (INDEPENDENT_AMBULATORY_CARE_PROVIDER_SITE_OTHER): Payer: Medicare Other | Admitting: Orthopedic Surgery

## 2023-06-03 DIAGNOSIS — M25511 Pain in right shoulder: Secondary | ICD-10-CM

## 2023-06-03 NOTE — Progress Notes (Signed)
Office Visit Note   Patient: Susan Sandoval           Date of Birth: 05-13-1956           MRN: [id number] Visit Date: 06/03/2023 Requested by: Melida Quitter, PA [address] [place],  [place] [address] PCP: Melida Quitter, PA  Subjective: Chief Complaint  Patient presents with   Right Shoulder - Routine Post Op    HPI: Susan Sandoval is a 67 y.o. female who presents to the office reporting right shoulder pain.  Had glenohumeral injection 04/08/2023 which gave her substantial relief.  Was painful the first day of the injection.  Currently she really does not have any pain.  She is doing the stretching.  Does not take any pain medicine.  Has normal ADLs.  Does have slight amount of stretching type pain at the end of range of motion...                ROS: All systems reviewed are negative as they relate to the chief complaint within the history of present illness.  Patient denies fevers or chills.  Assessment & Plan: Visit Diagnoses: No diagnosis found.  Plan: Impression is right frozen shoulder with very good response to glenohumeral injection.  Plan is continue with stretching.  Be careful with a lot of loading of the rotator cuff.  Would be unusual for this to recur again.  Follow-up as needed.  Follow-Up Instructions: No follow-ups on file.   Orders:  No orders of the defined types were placed in this encounter.  No orders of the defined types were placed in this encounter.     Procedures: No procedures performed   Clinical Data: No additional findings.  Objective: Vital Signs: There were no vitals taken for this visit.  Physical Exam:  Constitutional: Patient appears well-developed HEENT:  Head: Normocephalic Eyes:EOM are normal Neck: Normal range of motion Cardiovascular: Normal rate Pulmonary/chest: Effort normal Neurologic: Patient is alert Skin: Skin is warm Psychiatric: Patient has normal mood and affect  Ortho Exam: Ortho exam demonstrates  range of motion on the right of 50/100/170.  Range of motion on the left is 60/100/170.  Very good rotator cuff strength with no coarse grinding or crepitus on the right-hand side with passive range of motion.  She is able to fasten her bra now.  Specialty Comments:  No specialty comments available.  Imaging: No results found.   PMFS History: Patient Active Problem List   Diagnosis Date Noted   Right shoulder pain 12/31/2022   Insomnia 01/30/2022   Hyperlipidemia associated with type 2 diabetes mellitus (HCC) 03/27/2021   Graves disease 06/16/2020   Psoriasis 05/21/2020   Hypertension associated with type 2 diabetes mellitus (HCC) 12/10/2019   GAD (generalized anxiety disorder) 12/10/2019   Type 2 diabetes mellitus with diabetic neuropathy, unspecified (HCC) 12/09/2019   Past Medical History:  Diagnosis Date   Diabetes mellitus without complication (HCC)    Hyperlipidemia    Hypertension     Family History  Problem Relation Age of Onset   Hypertension Mother    Alzheimer's disease Mother    Diabetes Father    Hypertension Father    Stroke Father    Diabetes Sister    Hypertension Sister    Hypertension Maternal Grandmother    Cancer Maternal Grandmother        intestinal    Alcohol abuse Maternal Grandfather    Stroke Maternal Grandfather  Stroke Paternal Grandmother    Hypertension Paternal Grandfather    Stroke Paternal Grandfather     Past Surgical History:  Procedure Laterality Date   CESAREAN SECTION     CHOLECYSTECTOMY     EYE SURGERY     childhood   SPINE SURGERY     discectomy/ laminectomy L4-L5   WRIST SURGERY Right    Social History   Occupational History   Occupation: Retired Dietitian  Tobacco Use   Smoking status: Never    Passive exposure: Never   Smokeless tobacco: Never  Vaping Use   Vaping Use: Never used  Substance and Sexual Activity   Alcohol use: Yes    Alcohol/week: 2.0 standard drinks of alcohol    Types: 2 Glasses of wine  per week   Drug use: Never   Sexual activity: Yes

## 2023-06-07 ENCOUNTER — Encounter: Payer: Self-pay | Admitting: Cardiovascular Disease

## 2023-06-07 MED ORDER — METOPROLOL TARTRATE 25 MG PO TABS: 25.0000 mg | ORAL_TABLET | Freq: Two times a day (BID) | ORAL | 3 refills | Status: DC

## 2023-06-07 NOTE — Telephone Encounter (Signed)
Discussed information via phone with patient.  She ask for RX [facility] as she states it will only take a few days.  She did not want local [facility]. Appt made with NP for Tuesday.

## 2023-06-08 NOTE — Progress Notes (Unsigned)
Cardiology Clinic Note   Patient Name: Susan Sandoval Date of Encounter: 06/11/2023  Primary Care Provider:  Melida Quitter, PA Primary Cardiologist:  None  Patient Profile    Susan Sandoval 67 year old female presents to the clinic today for follow-up evaluation of her palpitations.  Past Medical History    Past Medical History:  Diagnosis Date   Diabetes mellitus without complication (HCC)    Hyperlipidemia    Hypertension    Past Surgical History:  Procedure Laterality Date   CESAREAN SECTION     CHOLECYSTECTOMY     EYE SURGERY     childhood   SPINE SURGERY     discectomy/ laminectomy L4-L5   WRIST SURGERY Right     Allergies  Allergies  Allergen Reactions   Statins Palpitations    "crawling in legs"    History of Present Illness    Susan Sandoval has a PMH of hypertension, diabetes, hyperlipidemia, and palpitations.  She was initially referred by her PCP for evaluation of palpitations.  She was seen by Dr. Flora Lipps on 05/09/2023.  During that time she reported however no episodes of palpitations which she described as fluttering in her chest.  The episodes would occur every 3 to 4 days.  They would last several minutes and then resolved.  She noted that increased physical activity and rushing would increase her symptoms.  She reported increased symptoms with increased stress.  She was walking in her neighborhood and doing water aerobics.  These activities did not make her symptoms worse.  She would note a sensation to cough and her symptoms would improve.  She denied chest pain and pressure.  Her EKG showed sinus rhythm with nonspecific ST-T wave changes.  Her echocardiogram 2022 was normal.  Her cardiovascular exam was normal.  She reported that she had been diagnosed with PVCs at [facility] several years prior.  She denied further recurrence or diagnosis.  She had not been followed by cardiology.  Her diabetes was controlled with her diet.  She reported staying  well-hydrated.  She denied tobacco use.  She denied excess alcohol consumption.  She denied drug use.  She is a retired Paramedic.  She was instructed about Kardia mobile device.  She contacted the office via MyChart on 06/07/2023.  She reported palpitations and provided a EKG with her Kardia mobile device.  It showed a heart rate of 178.  She was prescribed metoprolol to tartrate 25 mg twice daily.  She presents to the clinic today for follow-up evaluation and states she has had no further episodes of accelerated heartbeat or palpitations.  We reviewed her Kardia mobile EKG.  She expressed understanding.  She reports that she has not yet received her metoprolol.  Her blood pressure today is 120/86.  Her pulses 75.  Her EKG shows normal sinus rhythm LVH 75 bpm.  We reviewed triggers for elevated heart rate and vagal maneuvers.  She expressed understanding.  I will order a 30-day cardiac event monitor, have her hold her metoprolol at this time, and follow-up after her event monitor.  Today she denies chest pain, shortness of breath, lower extremity edema, fatigue, palpitations, melena, hematuria, hemoptysis, diaphoresis, weakness, presyncope, syncope, orthopnea, and PND.   Home Medications    Prior to Admission medications   Medication Sig Start Date End Date Taking? Authorizing Provider  Accu-Chek Softclix Lancets lancets Use to check blood sugars every morning fasting and 2 hours after largest meal 04/01/23   Edstrom,  Morgan A, PA  calcium carbonate (OS-CAL) 1250 (500 Ca) MG chewable tablet Chew 1 tablet by mouth daily.    [provider]  clobetasol (TEMOVATE) 0.05 % external solution Apply topically 2 (two) times daily. 04/01/23   Melida Quitter, PA  cyclobenzaprine (FLEXERIL) 10 MG tablet Take 1 tablet (10 mg total) by mouth 3 (three) times daily as needed for muscle spasms. 12/09/19   William Hamburger D, NP  enalapril (VASOTEC) 5 MG tablet TAKE 1 TABLET DAILY 05/16/23   Saralyn Pilar  A, PA  ezetimibe (ZETIA) 10 MG tablet Take 1 tablet (10 mg total) by mouth daily. 04/01/23   Melida Quitter, PA  Fenofibrate 50 MG CAPS Take 1 capsule (50 mg total) by mouth daily. 03/06/23   Saralyn Pilar A, PA  glucose blood (ACCU-CHEK GUIDE) test strip USE AS INSTRUCTED 04/01/23   Saralyn Pilar A, PA  hydrochlorothiazide (HYDRODIURIL) 12.5 MG tablet TAKE 1 TABLET DAILY 05/16/23   Melida Quitter, PA  Lancets Misc. (ACCU-CHEK SOFTCLIX LANCET DEV) KIT Use to check blood sugars every morning fasting and 2 hours after largest meal 03/29/21   Abonza, Maritza, PA-C  metFORMIN (GLUCOPHAGE) 500 MG tablet Take 1 tablet (500 mg total) by mouth 2 (two) times daily with a meal. 04/01/23   Melida Quitter, PA  methimazole (TAPAZOLE) 5 MG tablet Take 5 mg by mouth daily.    [provider]  metoprolol tartrate (LOPRESSOR) 25 MG tablet Take 1 tablet (25 mg total) by mouth 2 (two) times daily. 06/07/23 09/05/23  Sande Rives, MD  traZODone (DESYREL) 50 MG tablet TAKE 1/2 TO 1 TABLET AT    BEDTIME AS NEEDED FOR SLEEP 05/16/23   Melida Quitter, PA    Family History    Family History  Problem Relation Age of Onset   Hypertension Mother    Alzheimer's disease Mother    Diabetes Father    Hypertension Father    Stroke Father    Diabetes Sister    Hypertension Sister    Hypertension Maternal Grandmother    Cancer Maternal Grandmother        intestinal    Alcohol abuse Maternal Grandfather    Stroke Maternal Grandfather    Stroke Paternal Grandmother    Hypertension Paternal Grandfather    Stroke Paternal Grandfather    She indicated that the status of her mother is unknown. She indicated that the status of her father is unknown. She indicated that the status of her sister is unknown. She indicated that the status of her maternal grandmother is unknown. She indicated that the status of her maternal grandfather is unknown. She indicated that the status of her paternal grandmother is  unknown. She indicated that the status of her paternal grandfather is unknown.  Social History    Social History   Socioeconomic History   Marital status: Married    Spouse name: Not on file   Number of children: 2   Years of education: Not on file   Highest education level: Not on file  Occupational History   Occupation: Retired Dietitian  Tobacco Use   Smoking status: Never    Passive exposure: Never   Smokeless tobacco: Never  Vaping Use   Vaping Use: Never used  Substance and Sexual Activity   Alcohol use: Yes    Alcohol/week: 2.0 standard drinks of alcohol    Types: 2 Glasses of wine per week   Drug use: Never   Sexual activity: Yes  Other Topics Concern   Not on file  Social History Narrative   Not on file   Social Determinants of Health   Financial Resource Strain: Low Risk  (04/01/2023)   Overall Financial Resource Strain (CARDIA)    Difficulty of Paying Living Expenses: Not hard at all  Food Insecurity: No Food Insecurity (04/01/2023)   Hunger Vital Sign    Worried About Running Out of Food in the Last Year: Never true    Ran Out of Food in the Last Year: Never true  Transportation Needs: No Transportation Needs (04/01/2023)   PRAPARE - Administrator, Civil Service (Medical): No    Lack of Transportation (Non-Medical): No  Physical Activity: Insufficiently Active (04/01/2023)   Exercise Vital Sign    Days of Exercise per Week: 4 days    Minutes of Exercise per Session: 30 min  Stress: No Stress Concern Present (04/01/2023)   [employer] of Occupational Health - Occupational Stress Questionnaire    Feeling of Stress : Only a little  Social Connections: Moderately Integrated (04/01/2023)   Social Connection and Isolation Panel [NHANES]    Frequency of Communication with Friends and Family: More than three times a week    Frequency of Social Gatherings with Friends and Family: Twice a week    Attends Religious Services: Never    Loss adjuster, chartered or Organizations: Yes    Attends Engineer, structural: More than 4 times per year    Marital Status: Married  Catering manager Violence: Not At Risk (04/01/2023)   Humiliation, Afraid, Rape, and Kick questionnaire    Fear of Current or Ex-Partner: No    Emotionally Abused: No    Physically Abused: No    Sexually Abused: No     Review of Systems    General:  No chills, fever, night sweats or weight changes.  Cardiovascular:  No chest pain, dyspnea on exertion, edema, orthopnea, palpitations, paroxysmal nocturnal dyspnea. Dermatological: No rash, lesions/masses Respiratory: No cough, dyspnea Urologic: No hematuria, dysuria Abdominal:   No nausea, vomiting, diarrhea, bright red blood per rectum, melena, or hematemesis Neurologic:  No visual changes, wkns, changes in mental status. All other systems reviewed and are otherwise negative except as noted above.  Physical Exam    VS:  BP 120/86 (BP Location: Left Arm, Patient Position: Sitting, Cuff Size: Normal)   Pulse 75   Ht 5\' 8"  (1.727 m)   Wt 191 lb 9.6 oz (86.9 kg)   SpO2 96%   BMI 29.13 kg/m  , BMI Body mass index is 29.13 kg/m. GEN: Well nourished, well developed, in no acute distress. HEENT: normal. Neck: Supple, no JVD, carotid bruits, or masses. Cardiac: RRR, no murmurs, rubs, or gallops. No clubbing, cyanosis, edema.  Radials/DP/PT 2+ and equal bilaterally.  Respiratory:  Respirations regular and unlabored, clear to auscultation bilaterally. GI: Soft, nontender, nondistended, BS + x 4. MS: no deformity or atrophy. Skin: warm and dry, no rash. Neuro:  Strength and sensation are intact. Psych: Normal affect.  Accessory Clinical Findings    Recent Labs: 03/27/2023: ALT 20; BUN 14; Creatinine, Ser 0.69; Hemoglobin 14.8; Platelets 219; Potassium 3.5; Sodium 143; TSH 1.130   Recent Lipid Panel    Component Value Date/Time   CHOL 200 (H) 03/27/2023 0836   TRIG 84 03/27/2023 0836   HDL 56 03/27/2023  0836   CHOLHDL 3.6 03/27/2023 0836   LDLCALC 129 (H) 03/27/2023 1610  ECG personally reviewed by me today-normal sinus rhythm LVH 75 bpm- No acute changes   Echocardiogram 10/06/2021  IMPRESSIONS     1. Left ventricular ejection fraction, by estimation, is 60 to 65%. The  left ventricle has normal function. The left ventricle has no regional  wall motion abnormalities. Left ventricular diastolic parameters are  consistent with Grade I diastolic  dysfunction (impaired relaxation). The average left ventricular global  longitudinal strain is -22.7 %. The global longitudinal strain is normal.   2. Right ventricular systolic function is normal. The right ventricular  size is normal.   3. The mitral valve is normal in structure. Trivial mitral valve  regurgitation. No evidence of mitral stenosis.   4. The aortic valve is tricuspid. Aortic valve regurgitation is not  visualized. No aortic stenosis is present.   5. The inferior vena cava is normal in size with greater than 50%  respiratory variability, suggesting right atrial pressure of 3 mmHg.   Comparison(s): No prior Echocardiogram.   FINDINGS   Left Ventricle: Left ventricular ejection fraction, by estimation, is 60  to 65%. The left ventricle has normal function. The left ventricle has no  regional wall motion abnormalities. The average left ventricular global  longitudinal strain is -22.7 %.  The global longitudinal strain is normal. The left ventricular internal  cavity size was normal in size. There is no left ventricular hypertrophy.  Left ventricular diastolic parameters are consistent with Grade I  diastolic dysfunction (impaired  relaxation).   Right Ventricle: The right ventricular size is normal. Right ventricular  systolic function is normal.   Left Atrium: Left atrial size was normal in size.   Right Atrium: Right atrial size was normal in size.   Pericardium: There is no evidence of pericardial  effusion.   Mitral Valve: The mitral valve is normal in structure. Trivial mitral  valve regurgitation. No evidence of mitral valve stenosis.   Tricuspid Valve: The tricuspid valve is normal in structure. Tricuspid  valve regurgitation 3 tenosis.   Aortic Valve: The aortic valve is tricuspid. Aortic valve regurgitation is  not visualized. No aortic stenosis is present.   Pulmonic Valve: The pulmonic valve was normal in structure. Pulmonic valve  regurgitation is not visualized. No evidence of pulmonic stenosis.   Aorta: The aortic root is normal in size and structure.   Venous: The inferior vena cava is normal in size with greater than 50%  respiratory variability, suggesting right atrial pressure of 3 mmHg.   IAS/Shunts: No atrial level shunt detected by color flow Doppler.     Assessment & Plan   1.  Palpitations,SVT-no palpitations since starting metoprolol.  Kardia mobile device showed heart rate of 178 which appears to be SVT versus atrial fibrillation.   Had not yet started taking metoprolol, continue to hold Avoid triggers caffeine, chocolate, EtOH, dehydration etc. Maintain physical activity Reviewed vagal maneuvers Order 30-day cardiac event monitor  Essential hypertension-BP today 120/86. Maintain blood pressure log Continue current medical therapy Heart healthy low-sodium diet  Hyperlipidemia-reports compliance with fenofibrate and Zetia Heart healthy low-sodium high-fiber diet Maintain physical activity  Disposition: Follow-up with Dr. Flora Lipps or me after cardiac event monitor.   Thomasene Ripple. Ewin Rehberg NP-C     06/11/2023, 12:44 PM [facility] [address] Office [phone] Fax [phone]    I spent 14 minutes examining this patient, reviewing medications, and using patient centered shared decision making involving her cardiac care.  Prior to her visit I  spent greater than 20 minutes reviewing her past medical  history,  medications, and prior cardiac tests.

## 2023-06-09 ENCOUNTER — Other Ambulatory Visit: Payer: Self-pay | Admitting: Cardiovascular Disease

## 2023-06-11 ENCOUNTER — Ambulatory Visit: Payer: Medicare Other | Attending: General Practice | Admitting: General Practice

## 2023-06-11 ENCOUNTER — Encounter: Payer: Self-pay | Admitting: General Practice

## 2023-06-11 VITALS — BP 120/86 | HR 75 | Ht 68.0 in | Wt 191.6 lb

## 2023-06-11 DIAGNOSIS — I10 Essential (primary) hypertension: Secondary | ICD-10-CM | POA: Diagnosis not present

## 2023-06-11 DIAGNOSIS — I471 Supraventricular tachycardia, unspecified: Secondary | ICD-10-CM | POA: Diagnosis not present

## 2023-06-11 DIAGNOSIS — E1169 Type 2 diabetes mellitus with other specified complication: Secondary | ICD-10-CM | POA: Insufficient documentation

## 2023-06-11 DIAGNOSIS — E785 Hyperlipidemia, unspecified: Secondary | ICD-10-CM | POA: Insufficient documentation

## 2023-06-11 DIAGNOSIS — R002 Palpitations: Secondary | ICD-10-CM | POA: Diagnosis not present

## 2023-06-11 NOTE — Patient Instructions (Addendum)
Medication Instructions:  STOP METOPROLOL. Provider will let you know when to resume this medication *If you need a refill on your cardiac medications before your next appointment, please call your pharmacy*   Lab Work: none If you have labs (blood work) drawn today and your tests are completely normal, you will receive your results only by: MyChart Message (if you have MyChart) OR A paper copy in the mail If you have any lab test that is abnormal or we need to change your treatment, we will call you to review the results.   Testing/Procedures:  [employer] Cardiac Event Monitor Instructions  Your physician has requested you wear your cardiac event monitor for __30___ days, (1-30). [employer] may call or text to confirm a shipping address. The monitor will be sent to a land address via UPS. [employer] will not ship a monitor to a PO BOX. It typically takes 3-5 days to receive your monitor after it has been enrolled. [employer] will assist with USPS tracking if your package is delayed. The telephone number for [employer] is [phone]. Once you have received your monitor, please review the enclosed instructions. Instruction tutorials can also be viewed under help and settings on the enclosed cell phone. Your monitor has already been registered assigning a specific monitor serial # to you.  Billing and Self Pay Discount Information  [employer] has been provided the insurance information we had on file for you.  If your insurance has been updated, please call [employer] at [phone] to provide them with your updated insurance information.   [employer] offers a discounted Self Pay option for patients who have insurance that does not cover their cardiac event monitor or patients without insurance.  The discounted cost of a Self Pay Cardiac Event Monitor would be $225.00 , if the patient contacts [employer] at [phone] within 7 days of applying the monitor to make payment  arrangements.  If the patient does not contact [employer] within 7 days of applying the monitor, the cost of the cardiac event monitor will be $350.00.  Applying the monitor  Remove cell phone from case and turn it on. The cell phone works as IT consultant and needs to be within [employer] of you at all times. The cell phone will need to be charged on a daily basis. We recommend you plug the cell phone into the enclosed charger at your bedside table every night.  Monitor batteries: You will receive two monitor batteries labelled #1 and #2. These are your recorders. Plug battery #2 onto the second connection on the enclosed charger. Keep one battery on the charger at all times. This will keep the monitor battery deactivated. It will also keep it fully charged for when you need to switch your monitor batteries. A small light will be blinking on the battery emblem when it is charging. The light on the battery emblem will remain on when the battery is fully charged.  Open package of a Monitor strip. Insert battery #1 into black hood on strip and gently squeeze monitor battery onto connection as indicated in instruction booklet. Set aside while preparing skin.  Choose location for your strip, vertical or horizontal, as indicated in the instruction booklet. Shave to remove all hair from location. There cannot be any lotions, oils, powders, or colognes on skin where monitor is to be applied. Wipe skin clean with enclosed Saline wipe. Dry skin completely.  Peel paper labeled #1 off the back of the Monitor strip exposing the adhesive. Place the monitor on the  chest in the vertical or horizontal position shown in the instruction booklet. One arrow on the monitor strip must be pointing upward. Carefully remove paper labeled #2, attaching remainder of strip to your skin. Try not to create any folds or wrinkles in the strip as you apply it.  Firmly press and release the circle in the center of the  monitor battery. You will hear a small beep. This is turning the monitor battery on. The heart emblem on the monitor battery will light up every 5 seconds if the monitor battery in turned on and connected to the patient securely. Do not push and hold the circle down as this turns the monitor battery off. The cell phone will locate the monitor battery. A screen will appear on the cell phone checking the connection of your monitor strip. This may read poor connection initially but change to good connection within the next minute. Once your monitor accepts the connection you will hear a series of 3 beeps followed by a climbing crescendo of beeps. A screen will appear on the cell phone showing the two monitor strip placement options. Touch the picture that demonstrates where you applied the monitor strip.  Your monitor strip and battery are waterproof. You are able to shower, bathe, or swim with the monitor on. They just ask you do not submerge deeper than 3 feet underwater. We recommend removing the monitor if you are swimming in a lake, river, or ocean.  Your monitor battery will need to be switched to a fully charged monitor battery approximately once a week. The cell phone will alert you of an action which needs to be made.  On the cell phone, tap for details to reveal connection status, monitor battery status, and cell phone battery status. The green dots indicates your monitor is in good status. A red dot indicates there is something that needs your attention.  To record a symptom, click the circle on the monitor battery. In 30-60 seconds a list of symptoms will appear on the cell phone. Select your symptom and tap save. Your monitor will record a sustained or significant arrhythmia regardless of you clicking the button. Some patients do not feel the heart rhythm irregularities. [employer] will notify us of any serious or critical events.  Refer to instruction booklet for instructions on  switching batteries, changing strips, the Do not disturb or Pause features, or any additional questions.  Call [employer] at [phone], to confirm your monitor is transmitting and record your baseline. They will answer any questions you may have regarding the monitor instructions at that time.  Returning the monitor to [employer]  Place all equipment back into blue box. Peel off strip of paper to expose adhesive and close box securely. There is a prepaid UPS shipping label on this box. Drop in a UPS drop box, or at a UPS facility like [employer]. You may also contact [employer] to arrange UPS to pick up monitor package at your home.        Follow-Up: At [facility], you and your health needs are our priority.  As part of our continuing mission to provide you with exceptional heart care, we have created designated Provider Care Teams.  These Care Teams include your primary Cardiologist (physician) and Advanced Practice Providers (APPs -  Physician Assistants and Nurse Practitioners) who all work together to provide you with the care you need, when you need it.  We recommend signing up for the patient portal called "MyChart".  Sign up  information is provided on this After Visit Summary.  MyChart is used to connect with patients for Virtual Visits (Telemedicine).  Patients are able to view lab/test results, encounter notes, upcoming appointments, etc.  Non-urgent messages can be sent to your provider as well.   To learn more about what you can do with MyChart, go to [email or web address].    Your next appointment:   3 month(s)  Provider:   Edd Fabian, FNP        Other Instructions Avoid triggers that will cause palpitations. Use vagal maneuvers to assist with blood pressure such as coughing, applying cold water to face and avoiding caffeine foods and drinks

## 2023-06-12 ENCOUNTER — Other Ambulatory Visit: Payer: Self-pay

## 2023-06-12 DIAGNOSIS — E1140 Type 2 diabetes mellitus with diabetic neuropathy, unspecified: Secondary | ICD-10-CM

## 2023-06-12 DIAGNOSIS — E1169 Type 2 diabetes mellitus with other specified complication: Secondary | ICD-10-CM

## 2023-06-12 DIAGNOSIS — E1159 Type 2 diabetes mellitus with other circulatory complications: Secondary | ICD-10-CM

## 2023-06-12 DIAGNOSIS — E7800 Pure hypercholesterolemia, unspecified: Secondary | ICD-10-CM

## 2023-06-12 DIAGNOSIS — Z0000 Encounter for general adult medical examination without abnormal findings: Secondary | ICD-10-CM

## 2023-06-12 DIAGNOSIS — Z130 Encounter for screening for diseases of the blood and blood-forming organs and certain disorders involving the immune mechanism: Secondary | ICD-10-CM

## 2023-06-14 ENCOUNTER — Telehealth: Payer: Self-pay | Admitting: Cardiovascular Disease

## 2023-06-14 NOTE — Telephone Encounter (Signed)
Pt c/o medication issue:  1. Name of Medication:   metoprolol tartrate (LOPRESSOR) 25 MG tablet    2. How are you currently taking this medication (dosage and times per day)?   3. Are you having a reaction (difficulty breathing--STAT)? no  4. What is your medication issue? Calling for a drug interaction. Wanted to make sure it was okay to fill with patient already taking methimazole (TAPAZOLE) 5 MG tablet . Please advise

## 2023-06-14 NOTE — Telephone Encounter (Signed)
From PCP notes on 01/30/22, endocrinology d/c methimazole. Ok to fill metoprolol

## 2023-06-14 NOTE — Telephone Encounter (Signed)
Patient received metoprolol from mail order. Per Verdon Cummins [facility] note from 06/11/23 she is to stop metoprolol for now. She verbalized understanding.

## 2023-06-14 NOTE — Telephone Encounter (Signed)
Left voicemail for patient to return call to office. 

## 2023-06-14 NOTE — Telephone Encounter (Signed)
Pharmacist calling about possible drug interaction. Please advise

## 2023-06-17 ENCOUNTER — Other Ambulatory Visit: Payer: Self-pay | Admitting: Family Medicine

## 2023-06-17 DIAGNOSIS — I471 Supraventricular tachycardia, unspecified: Secondary | ICD-10-CM

## 2023-06-17 DIAGNOSIS — R002 Palpitations: Secondary | ICD-10-CM

## 2023-06-17 DIAGNOSIS — E7800 Pure hypercholesterolemia, unspecified: Secondary | ICD-10-CM

## 2023-06-20 ENCOUNTER — Telehealth: Payer: Self-pay

## 2023-06-20 NOTE — Telephone Encounter (Signed)
   Cardiac Monitor Alert  Date of alert:  06/19/23   Patient Name: Susan Sandoval  DOB: 1956/03/16  MRN: [id number]   [facility] Cardiologist: Dr. Nolon Stalls [facility] EP:  None    Monitor Information: Cardiac Event Monitor [[employer]]  Reason:  SVT Ordering provider:  Edd Fabian PA   Alert Supraventricular Tachycardia - fastest HR:  185 This is the 1st alert for this rhythm.   Next Cardiology Appointment   Date:  09/16/23  Provider:  Edd Fabian  The patient was contacted today.  She is asymptomatic. Arrhythmia, symptoms and history reviewed with Dr. Flora Lipps.   Plan: Continue with monitor and he will review end results.    Other:   Rendell Thivierge Merilynn Finland, LPN  03/18/8118 1:47 AM

## 2023-06-24 ENCOUNTER — Other Ambulatory Visit: Payer: Medicare Other

## 2023-06-24 DIAGNOSIS — E1140 Type 2 diabetes mellitus with diabetic neuropathy, unspecified: Secondary | ICD-10-CM

## 2023-06-24 DIAGNOSIS — E7800 Pure hypercholesterolemia, unspecified: Secondary | ICD-10-CM

## 2023-06-24 DIAGNOSIS — E1169 Type 2 diabetes mellitus with other specified complication: Secondary | ICD-10-CM

## 2023-06-24 DIAGNOSIS — Z130 Encounter for screening for diseases of the blood and blood-forming organs and certain disorders involving the immune mechanism: Secondary | ICD-10-CM

## 2023-06-24 DIAGNOSIS — Z0000 Encounter for general adult medical examination without abnormal findings: Secondary | ICD-10-CM

## 2023-06-24 DIAGNOSIS — I152 Hypertension secondary to endocrine disorders: Secondary | ICD-10-CM

## 2023-06-25 ENCOUNTER — Telehealth: Payer: Self-pay | Admitting: Cardiovascular Disease

## 2023-06-25 LAB — COMPREHENSIVE METABOLIC PANEL
ALT: 38 IU/L — ABNORMAL HIGH (ref 0–32)
AST: 26 IU/L (ref 0–40)
Albumin: 4.5 g/dL (ref 3.9–4.9)
Alkaline Phosphatase: 56 IU/L (ref 44–121)
BUN/Creatinine Ratio: 23 (ref 12–28)
BUN: 18 mg/dL (ref 8–27)
Bilirubin Total: 0.5 mg/dL (ref 0.0–1.2)
CO2: 23 mmol/L (ref 20–29)
Calcium: 9.7 mg/dL (ref 8.7–10.3)
Chloride: 103 mmol/L (ref 96–106)
Creatinine, Ser: 0.79 mg/dL (ref 0.57–1.00)
Globulin, Total: 2.3 g/dL (ref 1.5–4.5)
Glucose: 134 mg/dL — ABNORMAL HIGH (ref 70–99)
Potassium: 3.7 mmol/L (ref 3.5–5.2)
Sodium: 143 mmol/L (ref 134–144)
Total Protein: 6.8 g/dL (ref 6.0–8.5)
eGFR: 82 mL/min/{1.73_m2} (ref >59)

## 2023-06-25 LAB — CBC WITH DIFFERENTIAL/PLATELET
Basophils Absolute: 0 10*3/uL (ref 0.0–0.2)
Basos: 1 %
EOS (ABSOLUTE): 0.1 10*3/uL (ref 0.0–0.4)
Eos: 2 %
Hematocrit: 41.9 % (ref 34.0–46.6)
Hemoglobin: 14.6 g/dL (ref 11.1–15.9)
Immature Grans (Abs): 0 10*3/uL (ref 0.0–0.1)
Immature Granulocytes: 0 %
Lymphocytes Absolute: 1.2 10*3/uL (ref 0.7–3.1)
Lymphs: 22 %
MCH: 31.2 pg (ref 26.6–33.0)
MCHC: 34.8 g/dL (ref 31.5–35.7)
MCV: 90 fL (ref 79–97)
Monocytes Absolute: 0.4 10*3/uL (ref 0.1–0.9)
Monocytes: 7 %
Neutrophils Absolute: 3.9 10*3/uL (ref 1.4–7.0)
Neutrophils: 68 %
Platelets: 220 10*3/uL (ref 150–450)
RBC: 4.68 x10E6/uL (ref 3.77–5.28)
RDW: 12.8 % (ref 11.7–15.4)
WBC: 5.6 10*3/uL (ref 3.4–10.8)

## 2023-06-25 LAB — TSH: TSH: 1.12 u[IU]/mL (ref 0.450–4.500)

## 2023-06-25 LAB — LIPID PANEL
Chol/HDL Ratio: 3.2 ratio (ref 0.0–4.4)
Cholesterol, Total: 177 mg/dL (ref 100–199)
HDL: 56 mg/dL (ref >39)
LDL Chol Calc (NIH): 105 mg/dL — ABNORMAL HIGH (ref 0–99)
Triglycerides: 89 mg/dL (ref 0–149)
VLDL Cholesterol Cal: 16 mg/dL (ref 5–40)

## 2023-06-25 LAB — HEMOGLOBIN A1C
Est. average glucose Bld gHb Est-mCnc: 123 mg/dL
Hgb A1c MFr Bld: 5.9 % — ABNORMAL HIGH (ref 4.8–5.6)

## 2023-06-25 NOTE — Telephone Encounter (Signed)
Additional critical event faxed over. Reviewed and sent to STAT scan.

## 2023-06-25 NOTE — Telephone Encounter (Signed)
To DOD for review and then placed in STAT scan

## 2023-06-25 NOTE — Telephone Encounter (Signed)
Susan Sandoval with [employer] with alert  1:44 CST. Patient had episode of SVT  lasting 32 seconds, with heart rate 164/181  105 bpm    Second episode while on the phone 2:07 central time entire minute of SVT  with HR rate at 174 bpm  Patient states she was feeling the flutter and used her cardio mobile device to check and it stated HR in the 90's. She states she is fine.  Has had flutters all day. She has been shopping and "just feels something strange" She did feel the one at 2:07  Patient states saw her endocrinologist due to thyroid nodules and Chrons. She states they told her this could be caused by these conditions and she is going to have her thyroid medication changed. It will be Methimazole 6.5 mg Daily  They will send the reports asap

## 2023-06-25 NOTE — Telephone Encounter (Signed)
[employer] is calling to give critical EKG reading

## 2023-06-28 ENCOUNTER — Telehealth: Payer: Self-pay | Admitting: Cardiovascular Disease

## 2023-06-28 ENCOUNTER — Telehealth: Payer: Self-pay

## 2023-06-28 NOTE — Telephone Encounter (Addendum)
   Cardiac Monitor Alert  Date of alert:  06/28/2023   Patient Name: Susan Sandoval  DOB: 13-Jan-1956  MRN: [id number]   [facility] Cardiologist: Dr. Val Eagle' Jennette Kettle  [facility] EP:  None    Monitor Information: Cardiac Event Monitor [[employer]]  Reason:  Palpitations, Supraventricular Tachycardia  Ordering provider:  Edd Fabian, PA   Alert Supraventricular Tachycardia - fastest HR:  185 This is the 2nd alert for this rhythm.  Sinus Tachycardia w/PSVT (30 seconds)  Next Cardiology Appointment   Date:  09/16/23  Provider:  Edd Fabian  The patient was contacted today.  Patient stated she was at the grocery store with her husband when she stated to feel her heart beating fast. Pt stated she was not doing anything strenuous. She sat down for a while and her symptoms resolved. Pt did take her bp after the event 103/72, 98 HR. Pt is currently asymptomatic.   Arrhythmia, symptoms and history reviewed with Dr. Jens Som.   Plan:     Other:   Asencion Gowda, LPN  12/24/5206 0:22 PM

## 2023-06-28 NOTE — Telephone Encounter (Signed)
   Cardiac Monitor Alert  Date of alert:  06/28/2023   Patient Name: Susan Sandoval  DOB: [birth year]  MRN: [id number]   [facility] Cardiologist: Dr. Allena Napoleon [facility] EP:  None    Monitor Information: Cardiac Event Monitor [[employer]]  Reason:  Palpitations Ordering provider:  Edd Fabian PA   Alert Supraventricular Tachycardia - fastest HR:  200 This is the 4th alert for this rhythm.   Next Cardiology Appointment   Date:  07/02/23  Provider:  Dr. Scharlene Gloss  The patient was contacted today.  She is asymptomatic. Arrhythmia, symptoms and history reviewed with Dr. Jens Som.   Plan:  Start taking her metoprolol 25 BID and have f/u with provider or app    Other: She was at the grocery store and she states she did feel palpitations. Discussed ED precautions.  [name], LPN  03/18/8118 1:47 PM

## 2023-06-28 NOTE — Telephone Encounter (Signed)
Song with [employer] for critical EKG results

## 2023-06-29 NOTE — Progress Notes (Unsigned)
Cardiology Office Note:   Date:  07/02/2023  NAME:  Susan Sandoval    MRN: [id number] DOB:  1956-06-19   PCP:  Melida Quitter, PA  Cardiologist:  None  Electrophysiologist:  None   Referring MD: Melida Quitter, PA   Chief Complaint  Patient presents with   Follow-up    History of Present Illness:   Susan Sandoval is a 67 y.o. female with a hx of hyperthyroidism, HLD, HTN who presents for follow-up. Monitor shows brief SVT.  Monitor shows SVT.  She had an episode that was a bit prolonged.  It lasted more than 60 seconds.  She was started on metoprolol.  She tells me symptoms have improved but she has low energy.  Her endocrinologist has increased her methimazole.  They are trying to suppress her thyroid more.  We did discuss that this can result in more low energy.  Her blood pressure is also a bit low.  She is on enalapril, HCTZ and metoprolol.  We discussed likely stopping her HCTZ.  She is diabetic with an A1c of 5.8.  Primary care physician has recommended cholesterol-lowering medications but she cannot take statins.  She is intolerant of Zetia.  We discussed calcium scoring for further restratification.  Her A1c is 5.8 and she really is not diabetic.  Problem List DM -A1c 5.9 HLD -T chol 177, HDL 56, LDL 105, TG 89 HTN Hyperthyroidism  SVT  Past Medical History: Past Medical History:  Diagnosis Date   Diabetes mellitus without complication (HCC)    Hyperlipidemia    Hypertension     Past Surgical History: Past Surgical History:  Procedure Laterality Date   CESAREAN SECTION     CHOLECYSTECTOMY     EYE SURGERY     childhood   SPINE SURGERY     discectomy/ laminectomy L4-L5   WRIST SURGERY Right     Current Medications: Current Meds  Medication Sig   Accu-Chek Softclix Lancets lancets Use to check blood sugars every morning fasting and 2 hours after largest meal   calcium carbonate (OS-CAL) 1250 (500 Ca) MG chewable tablet Chew 1 tablet by mouth daily.    enalapril (VASOTEC) 5 MG tablet TAKE 1 TABLET DAILY   Fenofibrate 50 MG CAPS TAKE 1 CAPSULE DAILY   glucose blood (ACCU-CHEK GUIDE) test strip USE AS INSTRUCTED   Lancets Misc. (ACCU-CHEK SOFTCLIX LANCET DEV) KIT Use to check blood sugars every morning fasting and 2 hours after largest meal   metFORMIN (GLUCOPHAGE) 500 MG tablet Take 1 tablet (500 mg total) by mouth 2 (two) times daily with a meal.   methimazole (TAPAZOLE) 5 MG tablet Take 6.5 mg by mouth daily.   metoprolol succinate (TOPROL XL) 25 MG 24 hr tablet Take 1 tablet (25 mg total) by mouth at bedtime.   simvastatin (ZOCOR) 10 MG tablet Take 1 tablet (10 mg total) by mouth at bedtime.   traZODone (DESYREL) 50 MG tablet TAKE 1/2 TO 1 TABLET AT    BEDTIME AS NEEDED FOR SLEEP   [DISCONTINUED] hydrochlorothiazide (HYDRODIURIL) 12.5 MG tablet TAKE 1 TABLET DAILY     Allergies:    Statins   Social History: Social History   Socioeconomic History   Marital status: Married    Spouse name: Not on file   Number of children: 2   Years of education: Not on file   Highest education level: Not on file  Occupational History   Occupation: Retired Dietitian  Tobacco Use   Smoking  status: Never    Passive exposure: Never   Smokeless tobacco: Never  Vaping Use   Vaping status: Never Used  Substance and Sexual Activity   Alcohol use: Yes    Alcohol/week: 2.0 standard drinks of alcohol    Types: 2 Glasses of wine per week   Drug use: Never   Sexual activity: Yes  Other Topics Concern   Not on file  Social History Narrative   Not on file   Social Determinants of Health   Financial Resource Strain: Low Risk  (04/01/2023)   Overall Financial Resource Strain (CARDIA)    Difficulty of Paying Living Expenses: Not hard at all  Food Insecurity: No Food Insecurity (04/01/2023)   Hunger Vital Sign    Worried About Running Out of Food in the Last Year: Never true    Ran Out of Food in the Last Year: Never true  Transportation Needs: No  Transportation Needs (04/01/2023)   PRAPARE - Administrator, Civil Service (Medical): No    Lack of Transportation (Non-Medical): No  Physical Activity: Insufficiently Active (04/01/2023)   Exercise Vital Sign    Days of Exercise per Week: 4 days    Minutes of Exercise per Session: 30 min  Stress: No Stress Concern Present (04/01/2023)   [employer] of Occupational Health - Occupational Stress Questionnaire    Feeling of Stress : Only a little  Social Connections: Moderately Integrated (04/01/2023)   Social Connection and Isolation Panel [NHANES]    Frequency of Communication with Friends and Family: More than three times a week    Frequency of Social Gatherings with Friends and Family: Twice a week    Attends Religious Services: Never    Database administrator or Organizations: Yes    Attends Engineer, structural: More than 4 times per year    Marital Status: Married     Family History: The patient's family history includes Alcohol abuse in her maternal grandfather; Alzheimer's disease in her mother; Cancer in her maternal grandmother; Diabetes in her father and sister; Hypertension in her father, maternal grandmother, mother, paternal grandfather, and sister; Stroke in her father, maternal grandfather, paternal grandfather, and paternal grandmother.  ROS:   All other ROS reviewed and negative. Pertinent positives noted in the HPI.     EKGs/Labs/Other Studies Reviewed:   The following studies were personally reviewed by me today:  EKG:  EKG is not ordered today.        Recent Labs: 06/24/2023: ALT 38; BUN 18; Creatinine, Ser 0.79; Hemoglobin 14.6; Platelets 220; Potassium 3.7; Sodium 143; TSH 1.120   Recent Lipid Panel    Component Value Date/Time   CHOL 177 06/24/2023 0910   TRIG 89 06/24/2023 0910   HDL 56 06/24/2023 0910   CHOLHDL 3.2 06/24/2023 0910   LDLCALC 105 (H) 06/24/2023 0910    Physical Exam:   VS:  BP 118/70   Pulse (!) 55   Ht 5'  8" (1.727 m)   Wt 190 lb (86.2 kg)   SpO2 98%   BMI 28.89 kg/m    Wt Readings from Last 3 Encounters:  07/02/23 190 lb (86.2 kg)  07/01/23 190 lb (86.2 kg)  06/11/23 191 lb 9.6 oz (86.9 kg)    General: Well nourished, well developed, in no acute distress Head: Atraumatic, normal size  Eyes: PEERLA, EOMI  Neck: Supple, no JVD Endocrine: No thryomegaly Cardiac: Normal S1, S2; RRR; no murmurs, rubs, or gallops Lungs: Clear to auscultation bilaterally,  no wheezing, rhonchi or rales  Abd: Soft, nontender, no hepatomegaly  Ext: No edema, pulses 2+ Musculoskeletal: No deformities, BUE and BLE strength normal and equal Skin: Warm and dry, no rashes   Neuro: Alert and oriented to person, place, time, and situation, CNII-XII grossly intact, no focal deficits  Psych: Normal mood and affect   ASSESSMENT:   SELA FALK is a 67 y.o. female who presents for the following: 1. SVT (supraventricular tachycardia)   2. Essential hypertension     PLAN:   1. SVT (supraventricular tachycardia) 2. Essential hypertension -I have reviewed her monitor.  This does show SVT.  She is on metoprolol tartrate 25 mg twice daily.  Reports some low energy.  Thyroid medication has been increased (methimazole) so this could explain this as well.  We will transition to metoprolol succinate 25 mg daily at night.  We will update her echo.  We will stop her HCTZ as her blood pressure is within limits.  She will continue enalapril.  She reports no symptoms of angina or shortness of breath.  Given her history of diabetes she should be on some cholesterol-lowering medication.  We discussed calcium scoring for further risk stratification.  This may guide her lipid-lowering agents if needed.  She really is not diabetic anymore.  She will see me back in 3 months to discuss further.  Disposition: Return in about 3 months (around 10/02/2023).  Medication Adjustments/Labs and Tests Ordered: Current medicines are reviewed at  length with the patient today.  Concerns regarding medicines are outlined above.  Orders Placed This Encounter  Procedures   CT CARDIAC SCORING (SELF PAY ONLY)   ECHOCARDIOGRAM COMPLETE   Meds ordered this encounter  Medications   metoprolol succinate (TOPROL XL) 25 MG 24 hr tablet    Sig: Take 1 tablet (25 mg total) by mouth at bedtime.    Dispense:  90 tablet    Refill:  3   Patient Instructions  Medication Instructions:  Your physician has recommended you make the following change in your medication:   -Stop taking hydrochlorothiazide (hydrodiuril).  -Start taking metoprolol succinate (Toprol- XL) 25mg  at night.  *If you need a refill on your cardiac medications before your next appointment, please call your pharmacy*   Testing/Procedures: Your physician has requested that you have an echocardiogram. Echocardiography is a painless test that uses sound waves to create images of your heart. It provides your doctor with information about the size and shape of your heart and how well your heart's chambers and valves are working. This procedure takes approximately one hour. There are no restrictions for this procedure. Please do NOT wear cologne, perfume, aftershave, or lotions (deodorant is allowed). Please arrive 15 minutes prior to your appointment time. This will take place at [address] [place]   Dr. Flora Lipps has ordered a CT coronary calcium score.   Test locations:  [facility]  [facility] [facility]  This is $99 out of pocket.   Coronary CalciumScan A coronary calcium scan is an imaging test used to look for deposits of calcium and other fatty materials (plaques) in the inner lining of the blood vessels of the heart (coronary arteries). These deposits of calcium and plaques can partly clog and narrow the coronary arteries without producing any symptoms or warning signs. This puts a  person at risk for a heart attack. This test can detect these deposits before symptoms develop.  Tell a health care provider about: Any allergies you have. All medicines you are taking, including vitamins, herbs, eye drops, creams, and over-the-counter medicines. Any problems you or family members have had with anesthetic medicines. Any blood disorders you have. Any surgeries you have had. Any medical conditions you have. Whether you are pregnant or may be pregnant. What are the risks? Generally, this is a safe procedure. However, problems may occur, including: Harm to a pregnant woman and her unborn baby. This test involves the use of radiation. Radiation exposure can be dangerous to a pregnant woman and her unborn baby. If you are pregnant, you generally should not have this procedure done. Slight increase in the risk of cancer. This is because of the radiation involved in the test. What happens before the procedure? No preparation is needed for this procedure. What happens during the procedure? You will undress and remove any jewelry around your neck or chest. You will put on a hospital gown. Sticky electrodes will be placed on your chest. The electrodes will be connected to an electrocardiogram (ECG) machine to record a tracing of the electrical activity of your heart. A CT scanner will take pictures of your heart. During this time, you will be asked to lie still and hold your breath for 2-3 seconds while a picture of your heart is being taken. The procedure may vary among health care providers and hospitals. What happens after the procedure? You can get dressed. You can return to your normal activities. It is up to you to get the results of your test. Ask your health care provider, or the department that is doing the test, when your results will be ready. Summary A coronary calcium scan is an imaging test used to look for deposits of calcium and other fatty materials (plaques) in the  inner lining of the blood vessels of the heart (coronary arteries). Generally, this is a safe procedure. Tell your health care provider if you are pregnant or may be pregnant. No preparation is needed for this procedure. A CT scanner will take pictures of your heart. You can return to your normal activities after the scan is done. This information is not intended to replace advice given to you by your health care provider. Make sure you discuss any questions you have with your health care provider. Document Released: 05/24/2008 Document Revised: 10/15/2016 Document Reviewed: 10/15/2016 Elsevier Interactive Patient Education  2017 [employer].    Follow-Up: At [facility], you and your health needs are our priority.  As part of our continuing mission to provide you with exceptional heart care, we have created designated Provider Care Teams.  These Care Teams include your primary Cardiologist (physician) and Advanced Practice Providers (APPs -  Physician Assistants and Nurse Practitioners) who all work together to provide you with the care you need, when you need it.  We recommend signing up for the patient portal called "MyChart".  Sign up information is provided on this After Visit Summary.  MyChart is used to connect with patients for Virtual Visits (Telemedicine).  Patients are able to view lab/test results, encounter notes, upcoming appointments, etc.  Non-urgent messages can be sent to your provider as well.   To learn more about what you can do with MyChart, go to [email or web address].    Your next appointment:   3 month(s)  Provider:   Dr. Flora Lipps    Time Spent with Patient: I have spent a total of 35 minutes with patient reviewing hospital notes, telemetry,  EKGs, labs and examining the patient as well as establishing an assessment and plan that was discussed with the patient.  > 50% of time was spent in direct patient care.  Signed, Lenna Gilford. Flora Lipps, MD, [facility]  [facility]  [address], [address] [place], [place] [address] [phone]  07/02/2023 9:18 AM

## 2023-07-01 ENCOUNTER — Encounter: Payer: Self-pay | Admitting: Family Medicine

## 2023-07-01 ENCOUNTER — Ambulatory Visit: Payer: Medicare Other | Admitting: Family Medicine

## 2023-07-01 VITALS — BP 120/68 | HR 53 | Resp 18 | Ht 68.0 in | Wt 190.0 lb

## 2023-07-01 DIAGNOSIS — E1169 Type 2 diabetes mellitus with other specified complication: Secondary | ICD-10-CM | POA: Diagnosis not present

## 2023-07-01 DIAGNOSIS — E1159 Type 2 diabetes mellitus with other circulatory complications: Secondary | ICD-10-CM

## 2023-07-01 DIAGNOSIS — E1140 Type 2 diabetes mellitus with diabetic neuropathy, unspecified: Secondary | ICD-10-CM

## 2023-07-01 DIAGNOSIS — F411 Generalized anxiety disorder: Secondary | ICD-10-CM | POA: Diagnosis not present

## 2023-07-01 DIAGNOSIS — E785 Hyperlipidemia, unspecified: Secondary | ICD-10-CM

## 2023-07-01 DIAGNOSIS — I152 Hypertension secondary to endocrine disorders: Secondary | ICD-10-CM | POA: Diagnosis not present

## 2023-07-01 DIAGNOSIS — R5383 Other fatigue: Secondary | ICD-10-CM

## 2023-07-01 DIAGNOSIS — E0500 Thyrotoxicosis with diffuse goiter without thyrotoxic crisis or storm: Secondary | ICD-10-CM

## 2023-07-01 MED ORDER — SIMVASTATIN 10 MG PO TABS: 10.0000 mg | ORAL_TABLET | Freq: Every day | ORAL | 3 refills | Status: DC

## 2023-07-01 NOTE — Assessment & Plan Note (Signed)
BP goal <130/80. Stable, at goal. Continue enalapril 5 mg daily, hydrochlorothiazide 12.5 mg daily, ambulatory blood pressure monitoring. Follow up with cardiology tomorrow for adjustment of metoprolol tartrate 25 mg BID.

## 2023-07-01 NOTE — Patient Instructions (Signed)
We will try one more statin medicine to see if that works for you!

## 2023-07-01 NOTE — Assessment & Plan Note (Signed)
A1c fairly stable at 5.9 from 5.8. Continue metformin 500 mg BID. Will continue to monitor.

## 2023-07-01 NOTE — Assessment & Plan Note (Signed)
PHQ-9 score 11, GAD-7 score 9. Patient states mood is stable, she has no concerns.

## 2023-07-01 NOTE — Assessment & Plan Note (Signed)
Followed by endocrinology, they recommend continuing to check thyroid labs with PCP for them to review prior to her appointments. Also requested to check iron levels. Labs ordered today.

## 2023-07-01 NOTE — Progress Notes (Signed)
Established Patient Office Visit  Subjective   Patient ID: Susan Sandoval, female    DOB: Mar 25, 1956  Age: 67 y.o. MRN: [id number]  Chief Complaint  Patient presents with   Diabetes   Hyperlipidemia   Hypertension    HPI Susan Sandoval is a 67 y.o. female presenting today for follow up of hypertension, hyperlipidemia, diabetes. She states that mood feels stable and she is not having any issues other than feeling very low energy since starting metoprolol. Hypertension: Patient here for follow-up of elevated blood pressure. She is exercising and is adherent to low salt diet.   Pt denies chest pain, SOB, dizziness, edema, syncope. Taking enalapril, hydrochlorothiazide, reports excellent compliance with treatment. Denies side effects. She is being followed by cardiology with monitor for palpitations, which they believe are SVTs. Next follow up is tomorrow. Hyperlipidemia: Zetia caused her to feel down and listless. She also noticed darker urine, and the slight elevation of her ALT made her nervous so she stopped taking it right after receiving her lab results. The 10-year ASCVD risk score (Arnett DK, et al., 2019) is: 14.3% Diabetes: denies hypoglycemic events, wounds or sores that are not healing well, increased thirst or urination. Denies vision problems, eye exam completed 02/12/2023. Taking metformin as prescribed without any side effects.   ROS Negative unless otherwise noted in HPI   Objective:     BP 120/68 (BP Location: Left Arm, Patient Position: Sitting, Cuff Size: Normal)   Pulse (!) 53   Resp 18   Ht 5\' 8"  (1.727 m)   Wt 190 lb (86.2 kg)   SpO2 99%   BMI 28.89 kg/m   Physical Exam Constitutional:      General: She is not in acute distress.    Appearance: Normal appearance.  HENT:     Head: Normocephalic and atraumatic.  Cardiovascular:     Rate and Rhythm: Normal rate and regular rhythm.     Heart sounds: No murmur heard.    No friction rub. No gallop.  Pulmonary:      Effort: Pulmonary effort is normal. No respiratory distress.     Breath sounds: No wheezing, rhonchi or rales.  Skin:    General: Skin is warm and dry.  Neurological:     Mental Status: She is alert and oriented to person, place, and time.     Assessment & Plan:  Hypertension associated with type 2 diabetes mellitus (HCC) Assessment & Plan: BP goal <130/80. Stable, at goal. Continue enalapril 5 mg daily, hydrochlorothiazide 12.5 mg daily, ambulatory blood pressure monitoring. Follow up with cardiology tomorrow for adjustment of metoprolol tartrate 25 mg BID.   Hyperlipidemia associated with type 2 diabetes mellitus (HCC) Assessment & Plan: We discussed the importance of lowering LDL with goal of <70 to reduce her risk of cardiovascular events. Zetia caused concerns, 2 prior statins caused side effects as well. Agreeable to trial of third statin. After that point, she would qualify to try either pitavastatin which has a great side effect profile or Repatha injections. Will recheck lipid panel in 3 months.  Orders: -     Simvastatin; Take 1 tablet (10 mg total) by mouth at bedtime.  Dispense: 90 tablet; Refill: 3  Type 2 diabetes mellitus with diabetic neuropathy, without long-term current use of insulin (HCC) Assessment & Plan: A1c fairly stable at 5.9 from 5.8. Continue metformin 500 mg BID. Will continue to monitor.  Orders: -     Iron and TIBC; Future -  Ferritin; Future  GAD (generalized anxiety disorder) Assessment & Plan: PHQ-9 score 11, GAD-7 score 9. Patient states mood is stable, she has no concerns.   Graves disease Assessment & Plan: Followed by endocrinology, they recommend continuing to check thyroid labs with PCP for them to review prior to her appointments. Also requested to check iron levels. Labs ordered today.  Orders: -     Iron and TIBC; Future -     Ferritin; Future  Fatigue, unspecified type -     Iron and TIBC; Future -     Ferritin;  Future    Return in about 3 months (around 10/01/2023) for follow-up for HTN, HLD, A1C, fasting blood work 1 week before.    Melida Quitter, PA

## 2023-07-01 NOTE — Assessment & Plan Note (Signed)
We discussed the importance of lowering LDL with goal of <70 to reduce her risk of cardiovascular events. Zetia caused concerns, 2 prior statins caused side effects as well. Agreeable to trial of third statin. After that point, she would qualify to try either pitavastatin which has a great side effect profile or Repatha injections. Will recheck lipid panel in 3 months.

## 2023-07-02 ENCOUNTER — Ambulatory Visit: Payer: Medicare Other | Attending: Cardiovascular Disease | Admitting: Cardiovascular Disease

## 2023-07-02 ENCOUNTER — Other Ambulatory Visit: Payer: Self-pay | Admitting: Family Medicine

## 2023-07-02 ENCOUNTER — Encounter: Payer: Self-pay | Admitting: Cardiovascular Disease

## 2023-07-02 VITALS — BP 118/70 | HR 55 | Ht 68.0 in | Wt 190.0 lb

## 2023-07-02 DIAGNOSIS — E0500 Thyrotoxicosis with diffuse goiter without thyrotoxic crisis or storm: Secondary | ICD-10-CM

## 2023-07-02 DIAGNOSIS — E1169 Type 2 diabetes mellitus with other specified complication: Secondary | ICD-10-CM

## 2023-07-02 DIAGNOSIS — I10 Essential (primary) hypertension: Secondary | ICD-10-CM | POA: Insufficient documentation

## 2023-07-02 DIAGNOSIS — E1140 Type 2 diabetes mellitus with diabetic neuropathy, unspecified: Secondary | ICD-10-CM

## 2023-07-02 DIAGNOSIS — I471 Supraventricular tachycardia, unspecified: Secondary | ICD-10-CM | POA: Insufficient documentation

## 2023-07-02 DIAGNOSIS — R790 Abnormal level of blood mineral: Secondary | ICD-10-CM

## 2023-07-02 LAB — IRON AND TIBC
Iron Saturation: 33 % (ref 15–55)
Iron: 97 ug/dL (ref 27–139)
Total Iron Binding Capacity: 297 ug/dL (ref 250–450)
UIBC: 200 ug/dL (ref 118–369)

## 2023-07-02 LAB — FERRITIN: Ferritin: 401 ng/mL — ABNORMAL HIGH (ref 15–150)

## 2023-07-02 MED ORDER — METOPROLOL SUCCINATE ER 25 MG PO TB24: 25.0000 mg | ORAL_TABLET | Freq: Every day | ORAL | 3 refills | Status: DC

## 2023-07-02 NOTE — Patient Instructions (Signed)
Medication Instructions:  Your physician has recommended you make the following change in your medication:   -Stop taking hydrochlorothiazide (hydrodiuril).  -Start taking metoprolol succinate (Toprol- XL) 25mg  at night.  *If you need a refill on your cardiac medications before your next appointment, please call your pharmacy*   Testing/Procedures: Your physician has requested that you have an echocardiogram. Echocardiography is a painless test that uses sound waves to create images of your heart. It provides your doctor with information about the size and shape of your heart and how well your heart's chambers and valves are working. This procedure takes approximately one hour. There are no restrictions for this procedure. Please do NOT wear cologne, perfume, aftershave, or lotions (deodorant is allowed). Please arrive 15 minutes prior to your appointment time. This will take place at [address] [place]   Dr. Flora Lipps has ordered a CT coronary calcium score.   Test locations:  [facility]  [facility]  This is $99 out of pocket.   Coronary CalciumScan A coronary calcium scan is an imaging test used to look for deposits of calcium and other fatty materials (plaques) in the inner lining of the blood vessels of the heart (coronary arteries). These deposits of calcium and plaques can partly clog and narrow the coronary arteries without producing any symptoms or warning signs. This puts a person at risk for a heart attack. This test can detect these deposits before symptoms develop. Tell a health care provider about: Any allergies you have. All medicines you are taking, including vitamins, herbs, eye drops, creams, and over-the-counter medicines. Any problems you or family members have had with anesthetic medicines. Any blood disorders you have. Any surgeries you have had. Any medical  conditions you have. Whether you are pregnant or may be pregnant. What are the risks? Generally, this is a safe procedure. However, problems may occur, including: Harm to a pregnant woman and her unborn baby. This test involves the use of radiation. Radiation exposure can be dangerous to a pregnant woman and her unborn baby. If you are pregnant, you generally should not have this procedure done. Slight increase in the risk of cancer. This is because of the radiation involved in the test. What happens before the procedure? No preparation is needed for this procedure. What happens during the procedure? You will undress and remove any jewelry around your neck or chest. You will put on a hospital gown. Sticky electrodes will be placed on your chest. The electrodes will be connected to an electrocardiogram (ECG) machine to record a tracing of the electrical activity of your heart. A CT scanner will take pictures of your heart. During this time, you will be asked to lie still and hold your breath for 2-3 seconds while a picture of your heart is being taken. The procedure may vary among health care providers and hospitals. What happens after the procedure? You can get dressed. You can return to your normal activities. It is up to you to get the results of your test. Ask your health care provider, or the department that is doing the test, when your results will be ready. Summary A coronary calcium scan is an imaging test used to look for deposits of calcium and other fatty materials (plaques) in the inner lining of the blood vessels of the heart (coronary arteries). Generally, this is a safe procedure. Tell your health care provider if you are pregnant  or may be pregnant. No preparation is needed for this procedure. A CT scanner will take pictures of your heart. You can return to your normal activities after the scan is done. This information is not intended to replace advice given to you by your  health care provider. Make sure you discuss any questions you have with your health care provider. Document Released: 05/24/2008 Document Revised: 10/15/2016 Document Reviewed: 10/15/2016 Elsevier Interactive Patient Education  2017 [employer].    Follow-Up: At [facility], you and your health needs are our priority.  As part of our continuing mission to provide you with exceptional heart care, we have created designated Provider Care Teams.  These Care Teams include your primary Cardiologist (physician) and Advanced Practice Providers (APPs -  Physician Assistants and Nurse Practitioners) who all work together to provide you with the care you need, when you need it.  We recommend signing up for the patient portal called "MyChart".  Sign up information is provided on this After Visit Summary.  MyChart is used to connect with patients for Virtual Visits (Telemedicine).  Patients are able to view lab/test results, encounter notes, upcoming appointments, etc.  Non-urgent messages can be sent to your provider as well.   To learn more about what you can do with MyChart, go to [email or web address].    Your next appointment:   3 month(s)  Provider:   Dr. Flora Lipps

## 2023-07-19 ENCOUNTER — Ambulatory Visit: Payer: Medicare Other | Attending: General Practice

## 2023-07-19 DIAGNOSIS — I471 Supraventricular tachycardia, unspecified: Secondary | ICD-10-CM

## 2023-07-19 DIAGNOSIS — R002 Palpitations: Secondary | ICD-10-CM

## 2023-07-29 ENCOUNTER — Other Ambulatory Visit: Payer: Self-pay | Admitting: Family Medicine

## 2023-07-29 DIAGNOSIS — E7800 Pure hypercholesterolemia, unspecified: Secondary | ICD-10-CM

## 2023-08-05 ENCOUNTER — Ambulatory Visit (HOSPITAL_COMMUNITY): Payer: Medicare Other | Attending: Cardiovascular Disease

## 2023-08-05 DIAGNOSIS — I10 Essential (primary) hypertension: Secondary | ICD-10-CM | POA: Insufficient documentation

## 2023-08-05 DIAGNOSIS — I471 Supraventricular tachycardia, unspecified: Secondary | ICD-10-CM | POA: Insufficient documentation

## 2023-08-05 LAB — ECHOCARDIOGRAM COMPLETE
Area-P 1/2: 2.97 cm2
S' Lateral: 3.5 cm

## 2023-08-09 ENCOUNTER — Ambulatory Visit
Admission: RE | Admit: 2023-08-09 | Discharge: 2023-08-09 | Disposition: A | Discharge status: Home / Self Care | Payer: Medicare Other | Source: Ambulatory Visit | Attending: Cardiovascular Disease | Admitting: Cardiovascular Disease

## 2023-08-09 DIAGNOSIS — I471 Supraventricular tachycardia, unspecified: Secondary | ICD-10-CM | POA: Insufficient documentation

## 2023-08-09 DIAGNOSIS — I10 Essential (primary) hypertension: Secondary | ICD-10-CM

## 2023-08-20 ENCOUNTER — Other Ambulatory Visit: Payer: Self-pay | Admitting: Family Medicine

## 2023-08-20 DIAGNOSIS — G4700 Insomnia, unspecified: Secondary | ICD-10-CM

## 2023-08-20 DIAGNOSIS — L409 Psoriasis, unspecified: Secondary | ICD-10-CM

## 2023-08-20 DIAGNOSIS — I152 Hypertension secondary to endocrine disorders: Secondary | ICD-10-CM

## 2023-08-30 ENCOUNTER — Encounter: Payer: Self-pay | Admitting: Family Medicine

## 2023-08-30 DIAGNOSIS — L409 Psoriasis, unspecified: Secondary | ICD-10-CM

## 2023-09-02 MED ORDER — CLOBETASOL PROPIONATE 0.05 % EX SOLN
Freq: Two times a day (BID) | CUTANEOUS | 1 refills | Status: DC
Start: 2023-09-02 — End: 2023-09-02

## 2023-09-02 MED ORDER — CLOBETASOL PROPIONATE 0.05 % EX SOLN
Freq: Two times a day (BID) | CUTANEOUS | 1 refills | Status: AC
Start: 2023-09-02 — End: ?

## 2023-09-02 NOTE — Addendum Note (Signed)
Addended by: Tonny Bollman on: 09/02/2023 11:11 AM   Modules accepted: Orders

## 2023-09-16 ENCOUNTER — Ambulatory Visit: Payer: Medicare Other | Admitting: General Practice

## 2023-09-18 ENCOUNTER — Other Ambulatory Visit: Payer: Self-pay | Admitting: Family Medicine

## 2023-09-18 DIAGNOSIS — E7800 Pure hypercholesterolemia, unspecified: Secondary | ICD-10-CM

## 2023-09-18 DIAGNOSIS — E1140 Type 2 diabetes mellitus with diabetic neuropathy, unspecified: Secondary | ICD-10-CM

## 2023-09-23 ENCOUNTER — Other Ambulatory Visit: Payer: Self-pay

## 2023-09-23 DIAGNOSIS — E7800 Pure hypercholesterolemia, unspecified: Secondary | ICD-10-CM

## 2023-09-23 DIAGNOSIS — E1140 Type 2 diabetes mellitus with diabetic neuropathy, unspecified: Secondary | ICD-10-CM

## 2023-09-23 DIAGNOSIS — E1169 Type 2 diabetes mellitus with other specified complication: Secondary | ICD-10-CM

## 2023-09-23 DIAGNOSIS — E1159 Type 2 diabetes mellitus with other circulatory complications: Secondary | ICD-10-CM

## 2023-09-24 ENCOUNTER — Other Ambulatory Visit: Payer: Medicare Other

## 2023-09-24 DIAGNOSIS — R790 Abnormal level of blood mineral: Secondary | ICD-10-CM

## 2023-09-24 DIAGNOSIS — E0500 Thyrotoxicosis with diffuse goiter without thyrotoxic crisis or storm: Secondary | ICD-10-CM

## 2023-09-24 DIAGNOSIS — E1140 Type 2 diabetes mellitus with diabetic neuropathy, unspecified: Secondary | ICD-10-CM

## 2023-09-24 DIAGNOSIS — E7800 Pure hypercholesterolemia, unspecified: Secondary | ICD-10-CM

## 2023-09-24 DIAGNOSIS — I152 Hypertension secondary to endocrine disorders: Secondary | ICD-10-CM

## 2023-09-24 DIAGNOSIS — E1169 Type 2 diabetes mellitus with other specified complication: Secondary | ICD-10-CM

## 2023-09-24 NOTE — Progress Notes (Unsigned)
Cardiology Office Note:  .   Date:  09/26/2023  ID:  Susan Sandoval, DOB 1956/02/16, MRN [id number] PCP: Melida Quitter, PA  [facility] Providers Cardiologist:  Reatha Harps, MD }   History of Present Illness: .   Susan Sandoval is a 67 y.o. female with history of SVT, HTN, coronary calcium who presents for follow-up.   Discussed the use of AI scribe software for clinical note transcription with the patient, who gave verbal consent to proceed.  History of Present Illness   The patient, with a history of supraventricular tachycardia (SVT), hyperthyroidism, and hypertension, presents with concerns about the side effects of metoprolol. They report persistent drowsiness and low energy despite switching to the extended-release formulation and taking it at night. They also note a few instances of rapid heartbeat, which they describe as being preempted by a cough and a sensation in their chest, but these episodes stop abruptly, suggesting that the metoprolol is effective in controlling their SVT.  In addition to the drowsiness, the patient reports a new symptom of urinary urgency, similar to the sensation experienced with a urinary tract infection (UTI), but without the associated pain or changes in urine odor. They have not had their urine tested for a UTI. They have been drinking more water to manage this symptom, and note that their urine color has improved.  The patient also has a history of hyperthyroidism, which is being managed with methimazole. Their primary care provider recently increased the dosage of this medication, but the patient has not had a recent thyroid-stimulating hormone (TSH) test to assess their thyroid function.  The patient's hypertension is managed with enalapril, but recent blood pressure readings have been on the higher side. They also have a history of high cholesterol, with a recent LDL cholesterol level of 105. They have had issues tolerating statins in the  past, but are willing to try simvastatin to lower their LDL cholesterol level.          Problem List Coronary calcium -CAC 46.4 (69th percentile) 2. HLD  -T chol 177, HDL 56, LDL 105, TG 89 3. SVT -Dx 07/21/2023 -[place]  -LVEF 55-60% 08/05/2023 4. DM -A1c 5.9 5. HTN 6. Hyperthyroidism  -on methimazole     ROS: All other ROS reviewed and negative. Pertinent positives noted in the HPI.     Studies Reviewed: Marland Kitchen        TTE 08/05/2023  1. Left ventricular ejection fraction, by estimation, is 55 to 60%. Left  ventricular ejection fraction by PLAX is 55 %. The left ventricle has  normal function. Left ventricular endocardial border not optimally defined  to evaluate regional wall motion.  There is mild left ventricular hypertrophy. Left ventricular diastolic  parameters are consistent with Grade I diastolic dysfunction (impaired  relaxation).   2. Right ventricular systolic function is mildly reduced. The right  ventricular size is normal. Tricuspid regurgitation signal is inadequate  for assessing PA pressure.   3. The mitral valve is grossly normal. Trivial mitral valve  regurgitation.   4. The aortic valve is tricuspid. Aortic valve regurgitation is not  visualized.   5. The inferior vena cava is normal in size with greater than 50%  respiratory variability, suggesting right atrial pressure of 3 mmHg.  Physical Exam:   VS:  BP (!) 142/82 (BP Location: Right Arm, Patient Position: Sitting, Cuff Size: Normal)   Pulse 71   Ht 5\' 8"  (1.727 m)   Wt 192 lb 3.2  oz (87.2 kg)   SpO2 98%   BMI 29.22 kg/m    Wt Readings from Last 3 Encounters:  09/26/23 192 lb 3.2 oz (87.2 kg)  07/02/23 190 lb (86.2 kg)  07/01/23 190 lb (86.2 kg)    GEN: Well nourished, well developed in no acute distress NECK: No JVD; No carotid bruits CARDIAC: RRR, no murmurs, rubs, gallops RESPIRATORY:  Clear to auscultation without rales, wheezing or rhonchi  ABDOMEN: Soft, non-tender,  non-distended EXTREMITIES:  No edema; No deformity  ASSESSMENT AND PLAN: .   Assessment and Plan    Supraventricular Tachycardia (SVT) Patient reports drowsiness and urinary urgency on Metoprolol 25mg  extended release. Episodes of rapid heartbeat have decreased, suggesting efficacy of the medication. -Reduce Metoprolol to 12.5mg  daily. -Consider switching to Diltiazem if urinary symptoms persist.  Hypertension Blood pressure readings have been on the higher side. -Increase Enalapril from 5mg  to 10mg  daily.  Hyperlipidemia Coronary calcium score of 46.4 (69th percentile) and LDL of 105. Patient has had issues with statins in the past. -Start Simvastatin 10mg  daily. -Consider Zetia if patient experiences side effects with Simvastatin.  Hyperthyroidism Well controlled on Methimazole. -Continue current management.  Diabetes Well controlled with A1c of 5.5. -Continue current management.  Follow-up in 6 months. If patient experiences increased symptoms of SVT or side effects from Simvastatin, she should contact the office.              Follow-up: Return in about 6 months (around 03/26/2024).  Time Spent with Patient: I have spent a total of 35 minutes with patient reviewing hospital notes, telemetry, EKGs, labs and examining the patient as well as establishing an assessment and plan that was discussed with the patient.  > 50% of time was spent in direct patient care.  Signed, Lenna Gilford. Flora Lipps, MD, [facility]  [address], [address] [place], [place] [address] [phone]  10:00 AM

## 2023-09-25 LAB — LIPID PANEL
Chol/HDL Ratio: 4.3 {ratio} (ref 0.0–4.4)
Cholesterol, Total: 217 mg/dL — ABNORMAL HIGH (ref 100–199)
HDL: 51 mg/dL (ref >39)
LDL Chol Calc (NIH): 148 mg/dL — ABNORMAL HIGH (ref 0–99)
Triglycerides: 99 mg/dL (ref 0–149)
VLDL Cholesterol Cal: 18 mg/dL (ref 5–40)

## 2023-09-25 LAB — COMPREHENSIVE METABOLIC PANEL
ALT: 27 [IU]/L (ref 0–32)
AST: 20 [IU]/L (ref 0–40)
Albumin: 4.3 g/dL (ref 3.9–4.9)
Alkaline Phosphatase: 63 [IU]/L (ref 44–121)
BUN/Creatinine Ratio: 20 (ref 12–28)
BUN: 16 mg/dL (ref 8–27)
Bilirubin Total: 0.4 mg/dL (ref 0.0–1.2)
CO2: 25 mmol/L (ref 20–29)
Calcium: 9.7 mg/dL (ref 8.7–10.3)
Chloride: 105 mmol/L (ref 96–106)
Creatinine, Ser: 0.79 mg/dL (ref 0.57–1.00)
Globulin, Total: 2.3 g/dL (ref 1.5–4.5)
Glucose: 121 mg/dL — ABNORMAL HIGH (ref 70–99)
Potassium: 4.1 mmol/L (ref 3.5–5.2)
Sodium: 143 mmol/L (ref 134–144)
Total Protein: 6.6 g/dL (ref 6.0–8.5)
eGFR: 82 mL/min/{1.73_m2} (ref >59)

## 2023-09-25 LAB — HEMOGLOBIN A1C
Est. average glucose Bld gHb Est-mCnc: 123 mg/dL
Hgb A1c MFr Bld: 5.9 % — ABNORMAL HIGH (ref 4.8–5.6)

## 2023-09-25 LAB — IRON AND TIBC
Iron Saturation: 30 % (ref 15–55)
Iron: 88 ug/dL (ref 27–139)
Total Iron Binding Capacity: 294 ug/dL (ref 250–450)
UIBC: 206 ug/dL (ref 118–369)

## 2023-09-25 LAB — FERRITIN: Ferritin: 366 ng/mL — ABNORMAL HIGH (ref 15–150)

## 2023-09-26 ENCOUNTER — Ambulatory Visit: Payer: Medicare Other | Attending: Cardiovascular Disease | Admitting: Cardiovascular Disease

## 2023-09-26 ENCOUNTER — Encounter: Payer: Self-pay | Admitting: Cardiovascular Disease

## 2023-09-26 VITALS — BP 142/82 | HR 71 | Ht 68.0 in | Wt 192.2 lb

## 2023-09-26 DIAGNOSIS — I150 Renovascular hypertension: Secondary | ICD-10-CM | POA: Insufficient documentation

## 2023-09-26 DIAGNOSIS — R931 Abnormal findings on diagnostic imaging of heart and coronary circulation: Secondary | ICD-10-CM | POA: Insufficient documentation

## 2023-09-26 DIAGNOSIS — I471 Supraventricular tachycardia, unspecified: Secondary | ICD-10-CM | POA: Diagnosis present

## 2023-09-26 DIAGNOSIS — E782 Mixed hyperlipidemia: Secondary | ICD-10-CM | POA: Insufficient documentation

## 2023-09-26 MED ORDER — ENALAPRIL MALEATE 10 MG PO TABS: 10.0000 mg | ORAL_TABLET | Freq: Every day | ORAL | 3 refills | Status: DC

## 2023-09-26 MED ORDER — METOPROLOL SUCCINATE ER 25 MG PO TB24: ORAL_TABLET | ORAL | 3 refills | Status: DC

## 2023-09-26 NOTE — Patient Instructions (Addendum)
Medication Instructions:  - Start Metoprolol 12.5mg , once daily  - Start Enalapril 10mg , once daily    *If you need a refill on your cardiac medications before your next appointment, please call your pharmacy*   Lab Work: None    If you have labs (blood work) drawn today and your tests are completely normal, you will receive your results only by: MyChart Message (if you have MyChart) OR A paper copy in the mail If you have any lab test that is abnormal or we need to change your treatment, we will call you to review the results.   Testing/Procedures: None   Follow-Up: At [facility], you and your health needs are our priority.  As part of our continuing mission to provide you with exceptional heart care, we have created designated Provider Care Teams.  These Care Teams include your primary Cardiologist (physician) and Advanced Practice Providers (APPs -  Physician Assistants and Nurse Practitioners) who all work together to provide you with the care you need, when you need it.  We recommend signing up for the patient portal called "MyChart".  Sign up information is provided on this After Visit Summary.  MyChart is used to connect with patients for Virtual Visits (Telemedicine).  Patients are able to view lab/test results, encounter notes, upcoming appointments, etc.  Non-urgent messages can be sent to your provider as well.   To learn more about what you can do with MyChart, go to [email or web address].    Your next appointment:   6 month(s)  The format for your next appointment:   In Person  Provider:   Reatha Harps, MD

## 2023-10-01 ENCOUNTER — Ambulatory Visit (INDEPENDENT_AMBULATORY_CARE_PROVIDER_SITE_OTHER): Payer: Medicare Other | Admitting: Family Medicine

## 2023-10-01 ENCOUNTER — Encounter: Payer: Self-pay | Admitting: Family Medicine

## 2023-10-01 VITALS — BP 130/78 | HR 76 | Resp 18 | Ht 68.0 in | Wt 189.0 lb

## 2023-10-01 DIAGNOSIS — G4700 Insomnia, unspecified: Secondary | ICD-10-CM

## 2023-10-01 DIAGNOSIS — E1159 Type 2 diabetes mellitus with other circulatory complications: Secondary | ICD-10-CM | POA: Diagnosis not present

## 2023-10-01 DIAGNOSIS — E1140 Type 2 diabetes mellitus with diabetic neuropathy, unspecified: Secondary | ICD-10-CM | POA: Diagnosis not present

## 2023-10-01 DIAGNOSIS — I152 Hypertension secondary to endocrine disorders: Secondary | ICD-10-CM | POA: Diagnosis not present

## 2023-10-01 DIAGNOSIS — E1169 Type 2 diabetes mellitus with other specified complication: Secondary | ICD-10-CM

## 2023-10-01 DIAGNOSIS — E7800 Pure hypercholesterolemia, unspecified: Secondary | ICD-10-CM | POA: Insufficient documentation

## 2023-10-01 DIAGNOSIS — Z7984 Long term (current) use of oral hypoglycemic drugs: Secondary | ICD-10-CM

## 2023-10-01 DIAGNOSIS — E785 Hyperlipidemia, unspecified: Secondary | ICD-10-CM

## 2023-10-01 MED ORDER — TRAZODONE HCL 50 MG PO TABS: ORAL_TABLET | ORAL | 0 refills | Status: DC

## 2023-10-01 MED ORDER — FENOFIBRATE 50 MG PO CAPS: 1.0000 | ORAL_CAPSULE | Freq: Every day | ORAL | 1 refills | Status: DC

## 2023-10-01 NOTE — Assessment & Plan Note (Addendum)
Lipid panel: LDL 148, HDL 51, triglycerides 99.  LDL further increased, well above goal of <70 to reduce risk of cardiovascular events.  Continue simvastatin 10 mg daily and recheck lipid panel before next appointment.  We discussed that since this does have an effect on triglycerides as well, she may continue fenofibrate or discontinue.  She would like to simplify routine and will discontinue fenofibrate.  Will continue to monitor.

## 2023-10-01 NOTE — Progress Notes (Signed)
Established Patient Office Visit  Subjective   Patient ID: Susan Sandoval, female    DOB: 10/09/1956  Age: 67 y.o. MRN: [id number]  Chief Complaint  Patient presents with   Hyperlipidemia   Hypertension   Diabetes    HPI Susan Sandoval is a 67 y.o. female presenting today for follow up of hypertension, hyperlipidemia, diabetes. Hypertension: Patient here for follow-up of elevated blood pressure. She is exercising and is adherent to low salt diet.   Pt denies chest pain, SOB, dizziness, edema, syncope, fatigue or heart palpitations. Taking enalapril, hydrochlorothiazide, and metoprolol tartrate, reports excellent compliance with treatment. Denies side effects. Hyperlipidemia: At last appointment, initiated trial of simvastatin.  Due to changes that were made by cardiology and her metoprolol, she waited to start simvastatin until that routine was established.  She has now been taking simvastatin daily for about 5 days.   The 10-year ASCVD risk score (Arnett DK, et al., 2019) is: 19.1% Diabetes: denies hypoglycemic events, wounds or sores that are not healing well, increased thirst or urination. Denies vision problems, eye exam completed 02/12/2023.  Taking metformin as prescribed without any side effects.   Outpatient Medications Prior to Visit  Medication Sig   Accu-Chek Softclix Lancets lancets Use to check blood sugars every morning fasting and 2 hours after largest meal   calcium carbonate (OS-CAL) 1250 (500 Ca) MG chewable tablet Chew 1 tablet by mouth daily.   clobetasol (TEMOVATE) 0.05 % external solution Apply topically 2 (two) times daily.   enalapril (VASOTEC) 10 MG tablet Take 1 tablet (10 mg total) by mouth daily.   glucose blood (ACCU-CHEK GUIDE) test strip USE AS INSTRUCTED   Lancets Misc. (ACCU-CHEK SOFTCLIX LANCET DEV) KIT Use to check blood sugars every morning fasting and 2 hours after largest meal   metFORMIN (GLUCOPHAGE) 500 MG tablet TAKE 1 TABLET TWICE DAILY  WITH MEALS    methimazole (TAPAZOLE) 5 MG tablet Take 6.5 mg by mouth daily.   metoprolol succinate (TOPROL XL) 25 MG 24 hr tablet Take 1/2 tablet (12.5mg ) once a day   simvastatin (ZOCOR) 10 MG tablet TAKE 1 TABLET AT BEDTIME.   [DISCONTINUED] Fenofibrate 50 MG CAPS TAKE 1 CAPSULE DAILY   [DISCONTINUED] traZODone (DESYREL) 50 MG tablet TAKE 1/2 TO 1 TABLET AT    BEDTIME AS NEEDED FOR SLEEP   No facility-administered medications prior to visit.    ROS Negative unless otherwise noted in HPI   Objective:     BP 132/84 (BP Location: Left Arm, Patient Position: Sitting, Cuff Size: Normal)   Pulse 76   Resp 18   Ht 5\' 8"  (1.727 m)   Wt 189 lb (85.7 kg)   SpO2 97%   BMI 28.74 kg/m   Physical Exam Constitutional:      General: She is not in acute distress.    Appearance: Normal appearance.  HENT:     Head: Normocephalic and atraumatic.  Cardiovascular:     Rate and Rhythm: Normal rate and regular rhythm.     Heart sounds: No murmur heard.    No friction rub. No gallop.  Pulmonary:     Effort: Pulmonary effort is normal. No respiratory distress.     Breath sounds: No wheezing, rhonchi or rales.  Skin:    General: Skin is warm and dry.  Neurological:     Mental Status: She is alert and oriented to person, place, and time.    Diabetic Foot Exam - Simple   Simple Foot Form  Diabetic Foot exam was performed with the following findings: Yes 10/01/2023 10:08 AM  Visual Inspection No deformities, no ulcerations, no other skin breakdown bilaterally: Yes Sensation Testing Intact to touch and monofilament testing bilaterally: Yes Pulse Check Posterior Tibialis and Dorsalis pulse intact bilaterally: Yes Comments       10/01/2023    9:53 AM 07/01/2023   10:52 AM 04/01/2023   10:16 AM  Depression screen PHQ 2/9  Decreased Interest 1 3 2   Down, Depressed, Hopeless 1 2 0  PHQ - 2 Score 2 5 2   Altered sleeping 1 2 1   Tired, decreased energy 1 3 1   Change in appetite 0 0 2  Feeling bad or  failure about yourself  0 0 0  Trouble concentrating 0 0 0  Moving slowly or fidgety/restless 0 1 0  Suicidal thoughts 0 0 0  PHQ-9 Score 4 11 6   Difficult doing work/chores Not difficult at all Somewhat difficult Somewhat difficult      10/01/2023    9:53 AM 07/01/2023   10:53 AM 12/31/2022    8:44 AM 08/31/2022   10:47 AM  GAD 7 : Generalized Anxiety Score  Nervous, Anxious, on Edge 3 3 3 1   Control/stop worrying 2 2 3 1   Worry too much - different things 1 2 3 1   Trouble relaxing 0 0 0 0  Restless 0 0 0 0  Easily annoyed or irritable 0 1 0 0  Afraid - awful might happen 1 1 1  0  Total GAD 7 Score 7 9 10 3   Anxiety Difficulty Not difficult at all Not difficult at all Not difficult at all Not difficult at all    Assessment & Plan:  Hypertension associated with type 2 diabetes mellitus (HCC) Assessment & Plan: BP goal <130/80. Stable, at goal. Continue enalapril 5 mg daily, hydrochlorothiazide 12.5 mg daily, ambulatory blood pressure monitoring.  Continue routine follow-up with cardiology.   Hyperlipidemia associated with type 2 diabetes mellitus (HCC) Assessment & Plan: Lipid panel: LDL 148, HDL 51, triglycerides 99.  LDL further increased, well above goal of <70 to reduce risk of cardiovascular events.  Has now tried and failed 3 statins due to side effects: Atorvastatin, rosuvastatin, simvastatin.   Type 2 diabetes mellitus with diabetic neuropathy, without long-term current use of insulin (HCC) Assessment & Plan: A1c stable at 5.9.  Continue metformin 500 mg twice daily.  Will continue to monitor.   Elevated LDL cholesterol level -     Fenofibrate; Take 1 capsule (50 mg total) by mouth daily.  Dispense: 30 capsule; Refill: 1  Insomnia, unspecified type -     traZODone HCl; TAKE 1/2 TO 1 TABLET AT    BEDTIME AS NEEDED FOR SLEEP  Dispense: 30 tablet; Refill: 0    Return in about 4 months (around 02/01/2024) for follow-up for HTN, HLD, DM, fasting blood work 1 week  before.    Melida Quitter, PA

## 2023-10-01 NOTE — Assessment & Plan Note (Signed)
A1c stable at 5.9.  Continue metformin 500 mg twice daily.  Will continue to monitor.

## 2023-10-01 NOTE — Assessment & Plan Note (Addendum)
BP goal <130/80. Stable, at goal. Continue enalapril 5 mg daily, hydrochlorothiazide 12.5 mg daily, ambulatory blood pressure monitoring.  Continue routine follow-up with cardiology.  Discussed that ambulatory blood pressure monitoring should be checked after taking medication to assess efficacy rather before taking medication.

## 2023-10-01 NOTE — Patient Instructions (Signed)
Keep up the great work!

## 2023-10-17 ENCOUNTER — Other Ambulatory Visit: Payer: Medicare Other

## 2024-01-16 ENCOUNTER — Encounter: Payer: Self-pay | Admitting: Family Medicine

## 2024-01-16 ENCOUNTER — Other Ambulatory Visit: Payer: Self-pay

## 2024-01-16 DIAGNOSIS — E7800 Pure hypercholesterolemia, unspecified: Secondary | ICD-10-CM

## 2024-01-16 DIAGNOSIS — I152 Hypertension secondary to endocrine disorders: Secondary | ICD-10-CM

## 2024-01-16 DIAGNOSIS — E1169 Type 2 diabetes mellitus with other specified complication: Secondary | ICD-10-CM

## 2024-01-16 DIAGNOSIS — E1140 Type 2 diabetes mellitus with diabetic neuropathy, unspecified: Secondary | ICD-10-CM

## 2024-01-27 ENCOUNTER — Other Ambulatory Visit: Payer: Medicare Other

## 2024-01-27 DIAGNOSIS — I152 Hypertension secondary to endocrine disorders: Secondary | ICD-10-CM

## 2024-01-27 DIAGNOSIS — E1169 Type 2 diabetes mellitus with other specified complication: Secondary | ICD-10-CM

## 2024-01-27 DIAGNOSIS — E7800 Pure hypercholesterolemia, unspecified: Secondary | ICD-10-CM

## 2024-01-27 DIAGNOSIS — E1140 Type 2 diabetes mellitus with diabetic neuropathy, unspecified: Secondary | ICD-10-CM

## 2024-01-28 ENCOUNTER — Encounter: Payer: Self-pay | Admitting: Family Medicine

## 2024-01-28 LAB — COMPREHENSIVE METABOLIC PANEL
ALT: 23 [IU]/L (ref 0–32)
AST: 16 [IU]/L (ref 0–40)
Albumin: 4.4 g/dL (ref 3.9–4.9)
Alkaline Phosphatase: 63 [IU]/L (ref 44–121)
BUN/Creatinine Ratio: 17 (ref 12–28)
BUN: 13 mg/dL (ref 8–27)
Bilirubin Total: 0.4 mg/dL (ref 0.0–1.2)
CO2: 24 mmol/L (ref 20–29)
Calcium: 9.2 mg/dL (ref 8.7–10.3)
Chloride: 105 mmol/L (ref 96–106)
Creatinine, Ser: 0.75 mg/dL (ref 0.57–1.00)
Globulin, Total: 2.5 g/dL (ref 1.5–4.5)
Glucose: 127 mg/dL — ABNORMAL HIGH (ref 70–99)
Potassium: 3.7 mmol/L (ref 3.5–5.2)
Sodium: 144 mmol/L (ref 134–144)
Total Protein: 6.9 g/dL (ref 6.0–8.5)
eGFR: 87 mL/min/{1.73_m2} (ref >59)

## 2024-01-28 LAB — LIPID PANEL
Chol/HDL Ratio: 4.6 {ratio} — ABNORMAL HIGH (ref 0.0–4.4)
Cholesterol, Total: 234 mg/dL — ABNORMAL HIGH (ref 100–199)
HDL: 51 mg/dL (ref >39)
LDL Chol Calc (NIH): 164 mg/dL — ABNORMAL HIGH (ref 0–99)
Triglycerides: 107 mg/dL (ref 0–149)
VLDL Cholesterol Cal: 19 mg/dL (ref 5–40)

## 2024-01-28 LAB — HEMOGLOBIN A1C
Est. average glucose Bld gHb Est-mCnc: 134 mg/dL
Hgb A1c MFr Bld: 6.3 % — ABNORMAL HIGH (ref 4.8–5.6)

## 2024-02-03 ENCOUNTER — Ambulatory Visit (INDEPENDENT_AMBULATORY_CARE_PROVIDER_SITE_OTHER): Payer: Medicare Other | Admitting: Family Medicine

## 2024-02-03 ENCOUNTER — Encounter: Payer: Self-pay | Admitting: Family Medicine

## 2024-02-03 VITALS — BP 125/72 | HR 75 | Temp 97.8°F | Ht 68.0 in | Wt 192.0 lb

## 2024-02-03 DIAGNOSIS — E1159 Type 2 diabetes mellitus with other circulatory complications: Secondary | ICD-10-CM

## 2024-02-03 DIAGNOSIS — I471 Supraventricular tachycardia, unspecified: Secondary | ICD-10-CM | POA: Insufficient documentation

## 2024-02-03 DIAGNOSIS — E1169 Type 2 diabetes mellitus with other specified complication: Secondary | ICD-10-CM | POA: Diagnosis not present

## 2024-02-03 DIAGNOSIS — R300 Dysuria: Secondary | ICD-10-CM

## 2024-02-03 DIAGNOSIS — E1140 Type 2 diabetes mellitus with diabetic neuropathy, unspecified: Secondary | ICD-10-CM

## 2024-02-03 DIAGNOSIS — I152 Hypertension secondary to endocrine disorders: Secondary | ICD-10-CM

## 2024-02-03 DIAGNOSIS — E785 Hyperlipidemia, unspecified: Secondary | ICD-10-CM

## 2024-02-03 DIAGNOSIS — Z7984 Long term (current) use of oral hypoglycemic drugs: Secondary | ICD-10-CM

## 2024-02-03 LAB — POCT URINALYSIS DIPSTICK
Bilirubin, UA: NEGATIVE
Glucose, UA: NEGATIVE
Ketones, UA: NEGATIVE
Nitrite, UA: NEGATIVE
Protein, UA: NEGATIVE
Spec Grav, UA: >=1.03 — AB (ref 1.010–1.025)
Urobilinogen, UA: 0.2 U/dL
pH, UA: 6 (ref 5.0–8.0)

## 2024-02-03 LAB — POCT UA - MICROALBUMIN
Albumin/Creatinine Ratio, Urine, POC: <30
Creatinine, POC: 200 mg/dL
Microalbumin Ur, POC: 30 mg/L

## 2024-02-03 MED ORDER — DILTIAZEM HCL ER COATED BEADS 120 MG PO CP24
120.0000 mg | ORAL_CAPSULE | Freq: Every day | ORAL | 1 refills | Status: DC
Start: 2024-02-03 — End: 2024-02-04

## 2024-02-03 MED ORDER — METFORMIN HCL ER 500 MG PO TB24
500.0000 mg | ORAL_TABLET | Freq: Every day | ORAL | 0 refills | Status: DC
Start: 2024-02-03 — End: 2024-05-07

## 2024-02-03 NOTE — Patient Instructions (Addendum)
 START the simvastatin again. You could even start with just half a tablet (5 mg) daily if you notice that fasting blood sugars are consistently elevated.  STOP the metoprolol. START diltiazem instead, take it until your appointment with cardiology.

## 2024-02-03 NOTE — Addendum Note (Signed)
 Addended by: Philippa Chester on: 02/03/2024 02:12 PM   Modules accepted: Orders

## 2024-02-03 NOTE — Progress Notes (Signed)
 Established Patient Office Visit  Subjective   Patient ID: Susan Sandoval, female    DOB: Apr 25, 1956  Age: 68 y.o. MRN: [id number]  Chief Complaint  Patient presents with   Follow-up    HPI Susan Sandoval is a 68 y.o. female presenting today for follow up of hypertension, hyperlipidemia, diabetes. Hypertension: Pt denies chest pain, SOB, dizziness, edema, syncope, fatigue or heart palpitations. Taking enalapril, hydrochlorothiazide, reports excellent compliance with treatment. Denies side effects.  She was experiencing urinary urgency and dysuria and felt that the metoprolol was causing her blood sugars to increase.  Because of these issues, she stopped taking it.  She has not contacted cardiology to discuss the side effects.  She also notes that she experienced a couple of episodes of waking up and experiencing abnormal eye movements the day after she took metoprolol.  None of these episodes have occurred when she has skipped metoprolol. Hyperlipidemia: tolerated simvastatin 10 mg daily well with no myalgias or significant side effects, but she noticed that it was causing her blood sugar levels to increase so she stopped taking it.  Cardiology did perform coronary calcium, score of 46.4 (69th percentile). The 10-year ASCVD risk score (Arnett DK, et al., 2019) is: 17.8% Diabetes: denies hypoglycemic events, wounds or sores that are not healing well, increased thirst or urination. Denies vision problems, eye exam up-to-date.  Taking metformin as prescribed, endorses diarrhea as a side effect.   Outpatient Medications Prior to Visit  Medication Sig Note   Accu-Chek Softclix Lancets lancets Use to check blood sugars every morning fasting and 2 hours after largest meal    calcium carbonate (OS-CAL) 1250 (500 Ca) MG chewable tablet Chew 1 tablet by mouth daily.    clobetasol (TEMOVATE) 0.05 % external solution Apply topically 2 (two) times daily.    enalapril (VASOTEC) 10 MG tablet Take 1 tablet  (10 mg total) by mouth daily.    glucose blood (ACCU-CHEK GUIDE) test strip USE AS INSTRUCTED    Lancets Misc. (ACCU-CHEK SOFTCLIX LANCET DEV) KIT Use to check blood sugars every morning fasting and 2 hours after largest meal    methimazole (TAPAZOLE) 5 MG tablet Take 6.5 mg by mouth daily.    traZODone (DESYREL) 50 MG tablet TAKE 1/2 TO 1 TABLET AT    BEDTIME AS NEEDED FOR SLEEP    [DISCONTINUED] metFORMIN (GLUCOPHAGE) 500 MG tablet TAKE 1 TABLET TWICE DAILY  WITH MEALS    simvastatin (ZOCOR) 10 MG tablet TAKE 1 TABLET AT BEDTIME. (Patient not taking: Reported on 02/03/2024)    [DISCONTINUED] metoprolol succinate (TOPROL XL) 25 MG 24 hr tablet Take 1/2 tablet (12.5mg ) once a day (Patient not taking: Reported on 02/03/2024) 02/03/2024: patient discontinued medication on her own   No facility-administered medications prior to visit.    ROS Negative unless otherwise noted in HPI   Objective:     BP 125/72 Comment: average home BP  Pulse 75   Temp 97.8 F (36.6 C) (Temporal)   Ht 5\' 8"  (1.727 m)   Wt 192 lb (87.1 kg)   SpO2 99%   BMI 29.19 kg/m   Physical Exam Constitutional:      General: She is not in acute distress.    Appearance: Normal appearance.  HENT:     Head: Normocephalic and atraumatic.  Cardiovascular:     Rate and Rhythm: Normal rate and regular rhythm.     Heart sounds: No murmur heard.    No friction rub. No gallop.  Pulmonary:  Effort: Pulmonary effort is normal. No respiratory distress.     Breath sounds: No wheezing, rhonchi or rales.  Skin:    General: Skin is warm and dry.  Neurological:     Mental Status: She is alert and oriented to person, place, and time.    Results for orders placed or performed in visit on 02/03/24  POCT UA - Microalbumin  Result Value Ref Range   Microalbumin Ur, POC 30 mg/L   Creatinine, POC 200 mg/dL   Albumin/Creatinine Ratio, Urine, POC <30   POCT urinalysis dipstick  Result Value Ref Range   Color, UA yellow     Clarity, UA clear    Glucose, UA Negative Negative   Bilirubin, UA negative    Ketones, UA negative    Spec Grav, UA >=1.030 (A) 1.010 - 1.025   Blood, UA trace-intact    pH, UA 6.0 5.0 - 8.0   Protein, UA Negative Negative   Urobilinogen, UA 0.2 0.2 or 1.0 E.U./dL   Nitrite, UA negative    Leukocytes, UA Trace (A) Negative   Appearance     Odor       Assessment & Plan:  Hypertension associated with type 2 diabetes mellitus (HCC) Assessment & Plan: BP goal <130/80. Stable, at goal. Continue enalapril 10 mg daily, hydrochlorothiazide 12.5 mg daily, ambulatory blood pressure monitoring.  Continue routine follow-up with cardiology.  Discussed that ambulatory blood pressure monitoring should be checked after taking medication to assess efficacy rather before taking medication.   Hyperlipidemia associated with type 2 diabetes mellitus (HCC) Assessment & Plan: Lipid panel: LDL 164, HDL 51, triglycerides 107.  LDL further increased after discontinuing simvastatin, well above goal of <70 to reduce risk of cardiovascular events.  Cardiology did perform coronary calcium, score of 46.4 (69th percentile), this also puts her in a higher risk category.  Discussed the risks and benefits of simvastatin for cholesterol management particularly given that her A1c still remains at the goal.  Patient agreeable to trying simvastatin again.  If necessary, can increase metformin dose in the morning to metformin ER 750 mg.   Type 2 diabetes mellitus with diabetic neuropathy, without long-term current use of insulin (HCC) Assessment & Plan: A1c increased some to 6.3, still at goal <7.0.  Continue metformin 500 mg twice daily.  Will continue to monitor.  Orders: -     POCT UA - Microalbumin -     metFORMIN HCl ER; Take 1 tablet (500 mg total) by mouth daily with breakfast.  Dispense: 90 tablet; Refill: 0  SVT (supraventricular tachycardia) (HCC) Assessment & Plan: Per suggestion from cardiology on  09/26/2023, given that she continued to experience drowsiness and urinary urgency switch metoprolol to diltiazem.  Discontinue metoprolol, start diltiazem 120 mg daily.  Because of potential interactions, limit simvastatin to 10 mg daily and limit diltiazem to maximum 240 mg daily.  Orders: -     dilTIAZem HCl ER Coated Beads; Take 1 capsule (120 mg total) by mouth daily.  Dispense: 30 capsule; Refill: 1  Dysuria -     POCT urinalysis dipstick  Urinalysis shows leukocytes but no nitrites, will send for urine culture prior to starting potential antibiotic therapy.  Return in about 3 months (around 05/02/2024) for follow-up for HTN, HLD, DM, fasting labs 1 week before.    Melida Quitter, PA

## 2024-02-03 NOTE — Assessment & Plan Note (Signed)
 A1c increased some to 6.3, still at goal <7.0.  Continue metformin 500 mg twice daily.  Will continue to monitor.

## 2024-02-03 NOTE — Assessment & Plan Note (Addendum)
 BP goal <130/80. Stable, at goal. Continue enalapril 10 mg daily, hydrochlorothiazide 12.5 mg daily, ambulatory blood pressure monitoring.  Continue routine follow-up with cardiology.  Discussed that ambulatory blood pressure monitoring should be checked after taking medication to assess efficacy rather before taking medication.

## 2024-02-03 NOTE — Assessment & Plan Note (Addendum)
 Per suggestion from cardiology on 09/26/2023, given that she continued to experience drowsiness and urinary urgency switch metoprolol to diltiazem.  Discontinue metoprolol, start diltiazem 120 mg daily.  Because of potential interactions, limit simvastatin to 10 mg daily and limit diltiazem to maximum 240 mg daily.

## 2024-02-03 NOTE — Assessment & Plan Note (Addendum)
 Lipid panel: LDL 164, HDL 51, triglycerides 107.  LDL further increased after discontinuing simvastatin, well above goal of <70 to reduce risk of cardiovascular events.  Cardiology did perform coronary calcium, score of 46.4 (69th percentile), this also puts her in a higher risk category.  Discussed the risks and benefits of simvastatin for cholesterol management particularly given that her A1c still remains at the goal.  Patient agreeable to trying simvastatin again.  If necessary, can increase metformin dose in the morning to metformin ER 750 mg.

## 2024-02-04 ENCOUNTER — Other Ambulatory Visit: Payer: Self-pay | Admitting: Family Medicine

## 2024-02-04 DIAGNOSIS — I471 Supraventricular tachycardia, unspecified: Secondary | ICD-10-CM

## 2024-02-05 ENCOUNTER — Encounter: Payer: Self-pay | Admitting: Family Medicine

## 2024-02-05 LAB — URINE CULTURE: Organism ID, Bacteria: NO GROWTH

## 2024-02-27 ENCOUNTER — Other Ambulatory Visit: Payer: Self-pay | Admitting: Family Medicine

## 2024-02-27 DIAGNOSIS — G4700 Insomnia, unspecified: Secondary | ICD-10-CM

## 2024-03-24 NOTE — Progress Notes (Unsigned)
 Cardiology Office Note:  .   Date:  03/25/2024  ID:  Susan Sandoval, DOB 04-23-1956, MRN [id number] PCP: Noreene Bearded, PA   [facility] Providers Cardiologist:  Oneil Bigness, MD { History of Present Illness: .    Chief Complaint  Patient presents with   Follow-up         Susan Sandoval is a 68 y.o. female with history of SVT, HTN, HLD, hyperthyroidism who presents for follow-up.    History of Present Illness   A 68 year old female with SVT, hypertension, and hyperlipidemia presents for follow-up.  She has experienced significant improvement in her SVT episodes, with no recent occurrences. She mentions having had a couple of PACs but no recent episodes of SVT. She has a history of atrial tachycardia and has previously been on metoprolol, which effectively controlled her symptoms. However, she experienced adverse effects with long-term use, including urinary tract issues and eye symptoms. She is interested in using metoprolol on an as-needed basis. She stopped diltiazem due to intolerance.   Her blood pressure is elevated during the visit, but she attributes this to 'white coat syndrome,' noting that her home readings are typically in the 120s to 130s range. She uses an upper arm blood pressure cuff at home to monitor her levels.  Regarding her hyperlipidemia, she has had issues with statins, specifically simvastatin, which caused a 'crawling feeling' and tightness in her legs. Her most recent LDL cholesterol was 164, and she has an elevated coronary calcium score of 46.4. She undergoes a cholesterol panel every three months.  She maintains an active lifestyle, engaging in water aerobics in her swim spa and moderating her caffeine intake to one cup of coffee and one diet Lipton green tea daily. She emphasizes staying hydrated and reports no chest pain or trouble breathing. She also mentions having hyperthyroidism, which is controlled with methimazole.          Problem  List Coronary calcium -CAC 46.4 (69th percentile) 2. HLD  -T chol 234, HDL 51, LDL 164, TG 107 3. SVT -Dx 07/21/2023 -[facility]  -LVEF 55-60% 08/05/2023 4. DM -A1c 5.9 5. HTN 6. Hyperthyroidism  -on methimazole     ROS: All other ROS reviewed and negative. Pertinent positives noted in the HPI.     Studies Reviewed: Aaron Aas       Tele 07/21/2023 Impression: Paroxysmal supraventricular tachycardia detected.  Rare ectopy.  TTE 08/05/2023  1. Left ventricular ejection fraction, by estimation, is 55 to 60%. Left  ventricular ejection fraction by PLAX is 55 %. The left ventricle has  normal function. Left ventricular endocardial border not optimally defined  to evaluate regional wall motion.  There is mild left ventricular hypertrophy. Left ventricular diastolic  parameters are consistent with Grade I diastolic dysfunction (impaired  relaxation).   2. Right ventricular systolic function is mildly reduced. The right  ventricular size is normal. Tricuspid regurgitation signal is inadequate  for assessing PA pressure.   3. The mitral valve is grossly normal. Trivial mitral valve  regurgitation.   4. The aortic valve is tricuspid. Aortic valve regurgitation is not  visualized.   5. The inferior vena cava is normal in size with greater than 50%  respiratory variability, suggesting right atrial pressure of 3 mmHg.   CAC 08/23/2023 IMPRESSION AND RECOMMENDATION: 1. Coronary calcium score of 46.4. This was 69th percentile for age and sex matched control.   2. CAC 1-99 in LAD. CAC-DRS A1/N1.   3. Continue heart  healthy lifestyle and risk factor modification. Physical Exam:   VS:  BP (!) 164/92 (BP Location: Left Arm, Patient Position: Sitting, Cuff Size: Large)   Pulse 73   Ht 5\' 8"  (1.727 m)   Wt 191 lb 3.2 oz (86.7 kg)   SpO2 96%   BMI 29.07 kg/m    Wt Readings from Last 3 Encounters:  03/25/24 191 lb 3.2 oz (86.7 kg)  02/03/24 192 lb (87.1 kg)  10/01/23 189 lb (85.7 kg)    GEN:  Well nourished, well developed in no acute distress NECK: No JVD; No carotid bruits CARDIAC: RRR, no murmurs, rubs, gallops RESPIRATORY:  Clear to auscultation without rales, wheezing or rhonchi  ABDOMEN: Soft, non-tender, non-distended EXTREMITIES:  No edema; No deformity  ASSESSMENT AND PLAN: .   Assessment and Plan    Supraventricular Tachycardia (SVT) Significant improvement in SVT episodes with no recent occurrences. Experienced PACs but no SVT. Adverse effects with diltiazem and long-term metoprolol. Metoprolol as needed is reasonable. - Prescribe metoprolol 25 mg short-acting as needed for SVT episodes. - Advise on lifestyle modifications: hydration, adequate sleep, exercise, stress reduction.  Hypertension Blood pressure elevated in office, normal at home likely due to white coat syndrome. Home readings acceptable. - Monitor blood pressure at home and report significant changes.  Hyperlipidemia Elevated LDL at 164 mg/dL. Adverse effects with statins. Coronary calcium score elevated. Open to non-statin medications. Zetia suggested. - Prescribe Zetia 10 mg daily. - Monitor cholesterol levels and send results after next panel.              Follow-up: Return in about 1 year (around 03/25/2025).   Signed, Gigi Kyle. Rolm Clos, MD, [facility]  [facility]  [address] [place], [place] [address] [phone]  6:33 PM

## 2024-03-25 ENCOUNTER — Ambulatory Visit: Payer: Medicare Other | Attending: Cardiovascular Disease | Admitting: Cardiovascular Disease

## 2024-03-25 ENCOUNTER — Encounter: Payer: Self-pay | Admitting: Cardiovascular Disease

## 2024-03-25 VITALS — BP 164/92 | HR 73 | Ht 68.0 in | Wt 191.2 lb

## 2024-03-25 DIAGNOSIS — E782 Mixed hyperlipidemia: Secondary | ICD-10-CM | POA: Diagnosis present

## 2024-03-25 DIAGNOSIS — I471 Supraventricular tachycardia, unspecified: Secondary | ICD-10-CM | POA: Diagnosis present

## 2024-03-25 DIAGNOSIS — I150 Renovascular hypertension: Secondary | ICD-10-CM | POA: Diagnosis present

## 2024-03-25 DIAGNOSIS — R931 Abnormal findings on diagnostic imaging of heart and coronary circulation: Secondary | ICD-10-CM | POA: Diagnosis present

## 2024-03-25 MED ORDER — METOPROLOL TARTRATE 25 MG PO TABS: 25.0000 mg | ORAL_TABLET | ORAL | 3 refills | Status: AC | PRN

## 2024-03-25 MED ORDER — EZETIMIBE 10 MG PO TABS
10.0000 mg | ORAL_TABLET | Freq: Every day | ORAL | 3 refills | Status: DC
Start: 2024-03-25 — End: 2024-06-08

## 2024-03-25 NOTE — Patient Instructions (Signed)
 Medication Instructions:  - START ZETIA 10 MG BY MOUTH DAILY  - START METOPROLOL TARTRATE 25 MG AS NEEDED FOR PALPITATIONS.  - STOP DILTIAZEM     *If you need a refill on your cardiac medications before your next appointment, please call your pharmacy*   Lab Work: NONE    If you have labs (blood work) drawn today and your tests are completely normal, you will receive your results only by: MyChart Message (if you have MyChart) OR A paper copy in the mail If you have any lab test that is abnormal or we need to change your treatment, we will call you to review the results.   Testing/Procedures: NONE    Follow-Up: At [facility], you and your health needs are our priority.  As part of our continuing mission to provide you with exceptional heart care, we have created designated Provider Care Teams.  These Care Teams include your primary Cardiologist (physician) and Advanced Practice Providers (APPs -  Physician Assistants and Nurse Practitioners) who all work together to provide you with the care you need, when you need it.  We recommend signing up for the patient portal called "MyChart".  Sign up information is provided on this After Visit Summary.  MyChart is used to connect with patients for Virtual Visits (Telemedicine).  Patients are able to view lab/test results, encounter notes, upcoming appointments, etc.  Non-urgent messages can be sent to your provider as well.   To learn more about what you can do with MyChart, go to [email or web address].    Your next appointment:   1 year(s)  The format for your next appointment:   In Person  Provider:   Lawana Pray, FNP, Marcie Sever, PA-C, Callie Goodrich, PA-C, Kathleen Johnson, PA-C, Hao Meng, PA-C, Marlana Silvan, NP, or Katlyn West, NP      Other Instructions

## 2024-04-21 LAB — HM DIABETES EYE EXAM

## 2024-04-30 ENCOUNTER — Other Ambulatory Visit: Payer: Self-pay | Admitting: *Deleted

## 2024-04-30 DIAGNOSIS — E1169 Type 2 diabetes mellitus with other specified complication: Secondary | ICD-10-CM

## 2024-04-30 DIAGNOSIS — E1159 Type 2 diabetes mellitus with other circulatory complications: Secondary | ICD-10-CM

## 2024-05-01 ENCOUNTER — Other Ambulatory Visit

## 2024-05-01 DIAGNOSIS — E1169 Type 2 diabetes mellitus with other specified complication: Secondary | ICD-10-CM

## 2024-05-01 DIAGNOSIS — E1159 Type 2 diabetes mellitus with other circulatory complications: Secondary | ICD-10-CM

## 2024-05-02 ENCOUNTER — Ambulatory Visit: Payer: Self-pay | Admitting: Family Medicine

## 2024-05-02 LAB — CBC WITH DIFFERENTIAL/PLATELET
Basophils Absolute: 0 10*3/uL (ref 0.0–0.2)
Basos: 0 %
EOS (ABSOLUTE): 0.2 10*3/uL (ref 0.0–0.4)
Eos: 2 %
Hematocrit: 45.1 % (ref 34.0–46.6)
Hemoglobin: 15.1 g/dL (ref 11.1–15.9)
Immature Grans (Abs): 0 10*3/uL (ref 0.0–0.1)
Immature Granulocytes: 0 %
Lymphocytes Absolute: 1.3 10*3/uL (ref 0.7–3.1)
Lymphs: 14 %
MCH: 31.1 pg (ref 26.6–33.0)
MCHC: 33.5 g/dL (ref 31.5–35.7)
MCV: 93 fL (ref 79–97)
Monocytes Absolute: 0.5 10*3/uL (ref 0.1–0.9)
Monocytes: 6 %
Neutrophils Absolute: 7 10*3/uL (ref 1.4–7.0)
Neutrophils: 78 %
Platelets: 236 10*3/uL (ref 150–450)
RBC: 4.85 x10E6/uL (ref 3.77–5.28)
RDW: 12.4 % (ref 11.7–15.4)
WBC: 9 10*3/uL (ref 3.4–10.8)

## 2024-05-02 LAB — HEMOGLOBIN A1C
Est. average glucose Bld gHb Est-mCnc: 123 mg/dL
Hgb A1c MFr Bld: 5.9 % — ABNORMAL HIGH (ref 4.8–5.6)

## 2024-05-02 LAB — LIPID PANEL
Chol/HDL Ratio: 3.9 ratio (ref 0.0–4.4)
Cholesterol, Total: 189 mg/dL (ref 100–199)
HDL: 49 mg/dL (ref >39)
LDL Chol Calc (NIH): 118 mg/dL — ABNORMAL HIGH (ref 0–99)
Triglycerides: 122 mg/dL (ref 0–149)
VLDL Cholesterol Cal: 22 mg/dL (ref 5–40)

## 2024-05-02 LAB — COMPREHENSIVE METABOLIC PANEL WITH GFR
ALT: 58 IU/L — ABNORMAL HIGH (ref 0–32)
AST: 34 IU/L (ref 0–40)
Albumin: 4.4 g/dL (ref 3.9–4.9)
Alkaline Phosphatase: 76 IU/L (ref 44–121)
BUN/Creatinine Ratio: 21 (ref 12–28)
BUN: 15 mg/dL (ref 8–27)
Bilirubin Total: 0.3 mg/dL (ref 0.0–1.2)
CO2: 24 mmol/L (ref 20–29)
Calcium: 9.6 mg/dL (ref 8.7–10.3)
Chloride: 102 mmol/L (ref 96–106)
Creatinine, Ser: 0.71 mg/dL (ref 0.57–1.00)
Globulin, Total: 2.1 g/dL (ref 1.5–4.5)
Glucose: 120 mg/dL — ABNORMAL HIGH (ref 70–99)
Potassium: 3.9 mmol/L (ref 3.5–5.2)
Sodium: 143 mmol/L (ref 134–144)
Total Protein: 6.5 g/dL (ref 6.0–8.5)
eGFR: 93 mL/min/{1.73_m2} (ref >59)

## 2024-05-02 LAB — TSH: TSH: 1.36 u[IU]/mL (ref 0.450–4.500)

## 2024-05-07 ENCOUNTER — Encounter: Payer: Self-pay | Admitting: Family Medicine

## 2024-05-07 ENCOUNTER — Ambulatory Visit (INDEPENDENT_AMBULATORY_CARE_PROVIDER_SITE_OTHER): Admitting: Family Medicine

## 2024-05-07 VITALS — BP 143/82 | HR 83 | Ht 68.0 in | Wt 190.1 lb

## 2024-05-07 DIAGNOSIS — E785 Hyperlipidemia, unspecified: Secondary | ICD-10-CM

## 2024-05-07 DIAGNOSIS — I471 Supraventricular tachycardia, unspecified: Secondary | ICD-10-CM

## 2024-05-07 DIAGNOSIS — Z7984 Long term (current) use of oral hypoglycemic drugs: Secondary | ICD-10-CM

## 2024-05-07 DIAGNOSIS — E1169 Type 2 diabetes mellitus with other specified complication: Secondary | ICD-10-CM | POA: Diagnosis not present

## 2024-05-07 DIAGNOSIS — I152 Hypertension secondary to endocrine disorders: Secondary | ICD-10-CM

## 2024-05-07 DIAGNOSIS — E1159 Type 2 diabetes mellitus with other circulatory complications: Secondary | ICD-10-CM

## 2024-05-07 DIAGNOSIS — E1140 Type 2 diabetes mellitus with diabetic neuropathy, unspecified: Secondary | ICD-10-CM

## 2024-05-07 DIAGNOSIS — M25551 Pain in right hip: Secondary | ICD-10-CM | POA: Insufficient documentation

## 2024-05-07 DIAGNOSIS — M25511 Pain in right shoulder: Secondary | ICD-10-CM

## 2024-05-07 DIAGNOSIS — F411 Generalized anxiety disorder: Secondary | ICD-10-CM | POA: Diagnosis not present

## 2024-05-07 MED ORDER — CELECOXIB 100 MG PO CAPS: 100.0000 mg | ORAL_CAPSULE | Freq: Two times a day (BID) | ORAL | 3 refills | Status: DC

## 2024-05-07 MED ORDER — HYDROCHLOROTHIAZIDE 25 MG PO TABS
25.0000 mg | ORAL_TABLET | Freq: Every day | ORAL | 3 refills | Status: AC
Start: 2024-05-07 — End: ?

## 2024-05-07 MED ORDER — SERTRALINE HCL 25 MG PO TABS: 25.0000 mg | ORAL_TABLET | Freq: Every day | ORAL | 3 refills | Status: DC

## 2024-05-07 NOTE — Assessment & Plan Note (Signed)
 Continue trazodone  50 mg for sleep. Start Zoloft 25 mg for generalized anxiety. Discussed common side effects with patient, including GI upset. Discussed that therapeutic effects will take 4-6 weeks to notice. Will follow up in 6 weeks to reassess mood.

## 2024-05-07 NOTE — Assessment & Plan Note (Signed)
 Continue the recommendations and regular follow-up with cardiology. Taking Metoprolol  25 mg as needed for SVT episodes. Pt could not tolerate daily metoprolol  or diltiazem .

## 2024-05-07 NOTE — Assessment & Plan Note (Signed)
 A1c decreased to 5.9. At goal <7.0.  Continue metformin  500 mg twice daily.  Will continue to monitor.

## 2024-05-07 NOTE — Patient Instructions (Signed)
 It was nice to see you today!  As we discussed in clinic:  -Continue your metformin  500 mg twice a day -Continue your Zetia  for cholesterol  -Continue your enalapril  and hydrochlorothiazide  for blood pressure. I have sent in a refill of the 25 mg hydrochlorothiazide .  -Start Zoloft (sertraline) 25 mg for anxiety. Take in the mornings with food to avoid upset stomach. This medication will take about 4-6 weeks before you begin to notice a difference in mood.  -Start celebrex 100 mg twice per day to help with the inflammation in your hip. I have also placed a referral to physical therapy. They will call you to schedule an appointment.  -I have also placed the referral to Dr. Rozelle Corning for your shoulder. They will also call you to schedule.   Let's plan to follow up on anxiety in 6 weeks. Keep up the great work!   If you have any problems before your next visit feel free to message me via MyChart (minor issues or questions) or call the office, otherwise you may reach out to schedule an office visit.  Thank you! Meryl Acosta, PA-C

## 2024-05-07 NOTE — Assessment & Plan Note (Signed)
 Based on symptoms and pain with abduction, suspect an abductor tendonitis vs arthritis. Start Celebrex 100 mg BID. Ambulatory referral placed to physical therapy. Will re-evaluate in 6 weeks.

## 2024-05-07 NOTE — Progress Notes (Signed)
 Established Patient Office Visit  Subjective   Patient ID: Susan Sandoval, female    DOB: 1956/02/10  Age: 68 y.o. MRN: [id number]  Chief Complaint  Patient presents with   Hyperlipidemia   Hypertension   Diabetes    HPI  Susan Sandoval is a 68 y.o. female presenting today for follow up of hypertension, hyperlipidemia, diabetes.  Hypertension: Pt denies chest pain, SOB, dizziness, edema, syncope, fatigue or heart palpitations. Taking enalapril , hydrochlorothiazide , reports excellent compliance with treatment. Denies side effects.  Is checking her BP at home and getting readings on average of 110-120's/70's-80's.  Hyperlipidemia: Is not taking simvastatin  due to intolerance. The 10-year ASCVD risk score (Arnett DK, et al., 2019) is: 21.1%  At her most recent cardiology appointment, she was prescribed Zetia  to replace the statin, which she reports she is tolerating well.  Diabetes: denies hypoglycemic events, wounds or sores that are not healing well, increased thirst or urination. Denies vision problems, eye exam up-to-date.  Taking metformin  as prescribed, endorses morning diarrhea as a side effect but reports that it is manageable. Did not tolerate metformin  XR due to prolonged diarrhea.   SVT: Pt also follows with cardiology for history of SVT.  Due to intolerance and previously documented side effects she was experiencing from metoprolol , it was recommended that she start diltiazem  120 mg daily.  At her latest cardiology appointment in April, she reported adverse effects with the diltiazem  as well. Pt expressed interested is using Metoprolol  on an as needed basis, which her cardiologist deemed reasonable.   Right shoulder pain: Pt endorses right shoulder pain that has been going on for several months,but worsening. Reports that she has seen Dr. Rozelle Corning before about this and was diagnosed with frozen shoulder about 1 year ago (04/08/23). She was given a steroid injection at that time, which  she reports helped greatly but has worn off over the last few months. Pt is requesting referral back to Dr. Rozelle Corning for another injection and re-evaluation.    Right hip pain: Pt endorses right hip pain that started about 8 weeks ago. 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 It is worse when she stands up but improves as she starts walking. Endorses weakness in the legs (R>L) but denies numbness or tingling. Denies falls. Has been taking ibuprofen to relieve the pain.    Anxiety: Pt endorses feelings of anxiousness that she has dealt with all her life. She reports that she constantly worries about many different things and it interferes with her daily activities. She takes trazodone  50 mg for sleep, but is open to discussing SSRI therapy to help better control her anxiety.      ROS Per HPI.    Objective:     BP (!) 143/82   Pulse 83   Ht 5\' 8"  (1.727 m)   Wt 190 lb 1 oz (86.2 kg)   SpO2 97%   BMI 28.90 kg/m    Physical Exam Constitutional:      General: She is not in acute distress.    Appearance: Normal appearance.  Cardiovascular:     Rate and Rhythm: Normal rate and regular rhythm.     Heart sounds: Normal heart sounds. No murmur heard.    No friction rub. No gallop.  Pulmonary:     Effort: Pulmonary effort is normal. No respiratory distress.     Breath sounds: Normal breath sounds.  Musculoskeletal:        General: No swelling.     Right hip: No deformity, bony tenderness or crepitus. Decreased range of motion. Decreased strength.     Left hip: No deformity.  Skin:    General: Skin is warm and dry.  Neurological:     General: No focal deficit present.     Mental Status: She is alert.  Psychiatric:        Mood and Affect: Mood normal.        Behavior: Behavior normal.        Thought Content: Thought content normal.      No results found for any visits on  05/07/24.  Last CBC Lab Results  Component Value Date   WBC 9.0 05/01/2024   HGB 15.1 05/01/2024   HCT 45.1 05/01/2024   MCV 93 05/01/2024   MCH 31.1 05/01/2024   RDW 12.4 05/01/2024   PLT 236 05/01/2024   Last metabolic panel Lab Results  Component Value Date   GLUCOSE 120 (H) 05/01/2024   NA 143 05/01/2024   K 3.9 05/01/2024   CL 102 05/01/2024   CO2 24 05/01/2024   BUN 15 05/01/2024   CREATININE 0.71 05/01/2024   EGFR 93 05/01/2024   CALCIUM  9.6 05/01/2024   PROT 6.5 05/01/2024   ALBUMIN 4.4 05/01/2024   LABGLOB 2.1 05/01/2024   AGRATIO 1.9 03/27/2023   BILITOT 0.3 05/01/2024   ALKPHOS 76 05/01/2024   AST 34 05/01/2024   ALT 58 (H) 05/01/2024   Last lipids Lab Results  Component Value Date   CHOL 189 05/01/2024   HDL 49 05/01/2024   LDLCALC 118 (H) 05/01/2024   TRIG 122 05/01/2024   CHOLHDL 3.9 05/01/2024   Last hemoglobin A1c Lab Results  Component Value Date   HGBA1C 5.9 (H) 05/01/2024   Last thyroid  functions Lab Results  Component Value Date   TSH 1.360 05/01/2024   T3TOTAL 125 06/14/2020      The 10-year ASCVD risk score (Arnett DK, et al., 2019) is: 21.1%    Assessment & Plan:   Right hip pain Assessment & Plan: Based on symptoms and pain with abduction, suspect an abductor tendonitis vs arthritis. Start Celebrex  100 mg BID. Ambulatory referral placed to physical therapy. Will re-evaluate in 6 weeks.   Orders: -     Ambulatory referral to Physical Therapy  Right shoulder pain, unspecified chronicity Assessment & Plan: Has history of frozen shoulder. Saw Dr. Rozelle Corning 03/2023 and received steroid injection. Injection was successful in relieving pain up until the last few weeks. Pt requested referral to see Dr. Rozelle Corning again for another injection. Referral placed. Discussed that the Celebrex  prescribed for her knee pain should also help control her shoulder pain until she gets another injection.  Orders: -     Ambulatory referral to Orthopedic  Surgery  Hypertension associated with type 2 diabetes mellitus (HCC) Assessment & Plan: BP goal <130/80. Stable, at goal. Continue enalapril  10 mg daily, hydrochlorothiazide  25 mg daily, ambulatory blood pressure monitoring.  Continue routine follow-up with cardiology.    GAD (generalized anxiety disorder) Assessment & Plan: Continue trazodone  50 mg for sleep. Start Zoloft  25 mg for generalized anxiety. Discussed common side effects with patient, including GI upset. Discussed that therapeutic effects will take 4-6 weeks to notice. Will follow up in 6 weeks to reassess mood.   Type 2 diabetes mellitus with diabetic neuropathy, without long-term current use of insulin (HCC) Assessment & Plan: A1c decreased to 5.9. At  goal <7.0.  Continue metformin  500 mg twice daily.  Will continue to monitor.    Hyperlipidemia associated with type 2 diabetes mellitus (HCC) Assessment & Plan: Last lipid panel: LDL 118, HDL 49, Trig 122. Stable on Zetia  20 mg daily. Pt not tolerate of statins. Will continue to monitor.     SVT (supraventricular tachycardia) (HCC) Assessment & Plan: Continue the recommendations and regular follow-up with cardiology. Taking Metoprolol  25 mg as needed for SVT episodes. Pt could not tolerate daily metoprolol  or diltiazem .    Other orders -     hydroCHLOROthiazide ; Take 1 tablet (25 mg total) by mouth daily.  Dispense: 90 tablet; Refill: 3 -     Sertraline HCl; Take 1 tablet (25 mg total) by mouth daily.  Dispense: 30 tablet; Refill: 3 -     Celecoxib; Take 1 capsule (100 mg total) by mouth 2 (two) times daily.  Dispense: 30 capsule; Refill: 3    Return in about 6 weeks (around 06/18/2024) for Mood.    Odilia Bennett, PA-C

## 2024-05-07 NOTE — Assessment & Plan Note (Signed)
 Last lipid panel: LDL 118, HDL 49, Trig 122. Stable on Zetia  20 mg daily. Pt not tolerate of statins. Will continue to monitor.

## 2024-05-07 NOTE — Assessment & Plan Note (Signed)
 BP goal <130/80. Stable, at goal. Continue enalapril  10 mg daily, hydrochlorothiazide  25 mg daily, ambulatory blood pressure monitoring.  Continue routine follow-up with cardiology.

## 2024-05-07 NOTE — Assessment & Plan Note (Signed)
 Has history of frozen shoulder. Saw Dr. Rozelle Corning 03/2023 and received steroid injection. Injection was successful in relieving pain up until the last few weeks. Pt requested referral to see Dr. Rozelle Corning again for another injection. Referral placed. Discussed that the Celebrex prescribed for her knee pain should also help control her shoulder pain until she gets another injection.

## 2024-05-11 ENCOUNTER — Ambulatory Visit (INDEPENDENT_AMBULATORY_CARE_PROVIDER_SITE_OTHER): Admitting: Orthopedic Surgery

## 2024-05-11 ENCOUNTER — Other Ambulatory Visit (INDEPENDENT_AMBULATORY_CARE_PROVIDER_SITE_OTHER): Payer: Self-pay

## 2024-05-11 ENCOUNTER — Encounter: Payer: Self-pay | Admitting: Orthopedic Surgery

## 2024-05-11 ENCOUNTER — Other Ambulatory Visit: Payer: Self-pay

## 2024-05-11 DIAGNOSIS — G8929 Other chronic pain: Secondary | ICD-10-CM | POA: Diagnosis not present

## 2024-05-11 DIAGNOSIS — M25511 Pain in right shoulder: Secondary | ICD-10-CM

## 2024-05-11 DIAGNOSIS — M25551 Pain in right hip: Secondary | ICD-10-CM | POA: Diagnosis not present

## 2024-05-11 DIAGNOSIS — M25552 Pain in left hip: Secondary | ICD-10-CM

## 2024-05-11 DIAGNOSIS — M7501 Adhesive capsulitis of right shoulder: Secondary | ICD-10-CM

## 2024-05-12 ENCOUNTER — Encounter: Payer: Self-pay | Admitting: Orthopedic Surgery

## 2024-05-12 MED ORDER — BUPIVACAINE HCL 0.5 % IJ SOLN
9.0000 mL | INTRAMUSCULAR | Status: AC | PRN
Start: 2024-05-11 — End: 2024-05-11
  Administered 2024-05-11: 9 mL via INTRA_ARTICULAR

## 2024-05-12 MED ORDER — LIDOCAINE HCL 1 % IJ SOLN
5.0000 mL | INTRAMUSCULAR | Status: AC | PRN
Start: 2024-05-11 — End: 2024-05-11
  Administered 2024-05-11: 5 mL

## 2024-05-12 NOTE — Progress Notes (Signed)
 Office Visit Note   Patient: Susan Sandoval           Date of Birth: Nov 09, 1956           MRN: [id number] Visit Date: 05/11/2024 Requested by: Odilia Bennett, PA-C [address] [place],  [place] [address] PCP: Melene Sportsman  Subjective: Chief Complaint  Patient presents with   Right Shoulder - Pain    HPI: Susan Sandoval is a 68 y.o. female who presents to the office reporting right shoulder pain.  She has known history of adhesive capsulitis at that time.  Had an injection a year ago and it did help.  The pain restarted about 6 weeks ago.  Hurting throughout the shoulder and upper arm area on that right-hand side.  She is right-hand dominant.  Was prescribed Celebrex  but has not started it yet.  She also reports some new groin pain.  She was in an awkward position cleaning behind a [employer] and she developed bilateral groin pain.  Does not feel like it is coming from her back.  Physical therapy starts next week.  The right side is worse than the left..                ROS: All systems reviewed are negative as they relate to the chief complaint within the history of present illness.  Patient denies fevers or chills.  Assessment & Plan: Visit Diagnoses:  1. Chronic right shoulder pain   2. Bilateral hip pain     Plan: Impression is persistent diminished range of motion in that right shoulder with radiographs which show minimal to no arthritis in the glenohumeral joint.  Plan is ultrasound-guided injection into the right glenohumeral joint today with continuation of shoulder range of motion exercises.  Will see how she does with the groin pain right worse than left but we could consider right hip injection if symptoms do not improve.  Follow-Up Instructions: No follow-ups on file.   Orders:  Orders Placed This Encounter  Procedures   XR Shoulder Right   XR Pelvis 1-2 Views   US  Guided Needle Placement - No Linked Charges   No orders of the defined types were placed in  this encounter.     Procedures: Large Joint Inj: L glenohumeral on 05/11/2024 3:46 PM Indications: diagnostic evaluation and pain Details: 22 G 1.5 in needle, ultrasound-guided posterior approach  Arthrogram: No  Medications: 9 mL bupivacaine  0.5 %; 5 mL lidocaine  1 % Outcome: tolerated well, no immediate complications Procedure, treatment alternatives, risks and benefits explained, specific risks discussed. Consent was given by the patient. Immediately prior to procedure a time out was called to verify the correct patient, procedure, equipment, support staff and site/side marked as required. Patient was prepped and draped in the usual sterile fashion.    Kenalog injected   Clinical Data: No additional findings.  Objective: Vital Signs: There were no vitals taken for this visit.  Physical Exam:  Constitutional: Patient appears well-developed HEENT:  Head: Normocephalic Eyes:EOM are normal Neck: Normal range of motion Cardiovascular: Normal rate Pulmonary/chest: Effort normal Neurologic: Patient is alert Skin: Skin is warm Psychiatric: Patient has normal mood and affect  Ortho Exam: Ortho exam demonstrates mild groin pain on the right with internal/external rotation of the leg.  There is no discrete restriction of internal rotation on either side.  There is sensory function of bilateral lower extremities intact with palpable pedal pulses.  Right shoulder exam demonstrates range  of motion of 30/80/155.  Left shoulder exam range of motion demonstrates 70/95/175.  Rotator cuff strength is intact bilaterally to infraspinatus supraspinatus and subscap muscle testing with no coarse grinding or crepitus in either shoulder with passive range of motion.  No discrete AC joint tenderness is present.  Cervical spine range of motion is full.  Specialty Comments:  No specialty comments available.  Imaging: No results found.   PMFS History: Patient Active Problem List   Diagnosis Date  Noted   Right hip pain 05/07/2024   SVT (supraventricular tachycardia) (HCC) 02/03/2024   Elevated LDL cholesterol level 10/01/2023   Right shoulder pain 12/31/2022   Insomnia 01/30/2022   Hyperlipidemia associated with type 2 diabetes mellitus (HCC) 03/27/2021   Graves disease 06/16/2020   Psoriasis 05/21/2020   Hypertension associated with type 2 diabetes mellitus (HCC) 12/10/2019   GAD (generalized anxiety disorder) 12/10/2019   Type 2 diabetes mellitus with diabetic neuropathy, unspecified (HCC) 12/09/2019   Past Medical History:  Diagnosis Date   Diabetes mellitus without complication (HCC)    Hyperlipidemia    Hypertension     Family History  Problem Relation Age of Onset   Hypertension Mother    Alzheimer's disease Mother    Diabetes Father    Hypertension Father    Stroke Father    Diabetes Sister    Hypertension Sister    Hypertension Maternal Grandmother    Cancer Maternal Grandmother        intestinal    Alcohol abuse Maternal Grandfather    Stroke Maternal Grandfather    Stroke Paternal Grandmother    Hypertension Paternal Grandfather    Stroke Paternal Grandfather     Past Surgical History:  Procedure Laterality Date   CESAREAN SECTION     CHOLECYSTECTOMY     EYE SURGERY     childhood   SPINE SURGERY     discectomy/ laminectomy L4-L5   WRIST SURGERY Right    Social History   Occupational History   Occupation: Retired Dietitian  Tobacco Use   Smoking status: Never    Passive exposure: Never   Smokeless tobacco: Never  Vaping Use   Vaping status: Never Used  Substance and Sexual Activity   Alcohol use: Yes    Alcohol/week: 2.0 standard drinks of alcohol    Types: 2 Glasses of wine per week   Drug use: Never   Sexual activity: Yes

## 2024-05-20 NOTE — Therapy (Signed)
 OUTPATIENT PHYSICAL THERAPY LOWER EXTREMITY EVALUATION   Patient Name: Susan Sandoval MRN: [id number] DOB:[birth year], 68 y.o., female Today's Date: 05/22/2024  END OF SESSION:  PT End of Session - 05/22/24 0629     Visit Number 1    Number of Visits 13    Date for PT Re-Evaluation 07/10/24    Authorization Type MEDICARE PART A AND B;  [employer] PPO    PT Start Time 1415    PT Stop Time 1500    PT Time Calculation (min) 45 min    Activity Tolerance Patient tolerated treatment well    Behavior During Therapy WFL for tasks assessed/performed          Past Medical History:  Diagnosis Date   Diabetes mellitus without complication (HCC)    Hyperlipidemia    Hypertension    Past Surgical History:  Procedure Laterality Date   CESAREAN SECTION     CHOLECYSTECTOMY     EYE SURGERY     childhood   SPINE SURGERY     discectomy/ laminectomy L4-L5   WRIST SURGERY Right    Patient Active Problem List   Diagnosis Date Noted   Right hip pain 05/07/2024   SVT (supraventricular tachycardia) (HCC) 02/03/2024   Elevated LDL cholesterol level 10/01/2023   Right shoulder pain 12/31/2022   Insomnia 01/30/2022   Hyperlipidemia associated with type 2 diabetes mellitus (HCC) 03/27/2021   Graves disease 06/16/2020   Psoriasis 05/21/2020   Hypertension associated with type 2 diabetes mellitus (HCC) 12/10/2019   GAD (generalized anxiety disorder) 12/10/2019   Type 2 diabetes mellitus with diabetic neuropathy, unspecified (HCC) 12/09/2019    PCP: Odilia Bennett, PA-C   REFERRING PROVIDER: Odilia Bennett, PA-C   REFERRING DIAG: [phone] (ICD-10-CM) - Right hip pain   THERAPY DIAG:  Pain in right hip  Muscle weakness (generalized)  Difficulty in walking, not elsewhere classified  Rationale for Evaluation and Treatment: Rehabilitation  ONSET DATE: 3 months  SUBJECTIVE:   SUBJECTIVE STATEMENT: Pt reports she injured her pelvis/hips when 3 months ago cleaning around the  baseboard behind her toilet and she pressed against her pelvis against the front of the toilet. When she stood up, she experienced R anterior hip pain. Since this initial inicident, the pain has become worse, and she is also experience pain to a lesser degree of the her L anterior hip. She endorses weakness of both her legs. Pt notes after receiving a cortisone injection for her R shoulder her hip pain resolved for a period of time.   PERTINENT HISTORY: DM2  PAIN:  Are you having pain? Yes: NPRS scale: 8/10 Pain location: R anterior hip > L Pain description: sharp, ache, intermittent Aggravating factors: Sitting to standing, initial walking, , turning when walking Relieving factors: Rest, whirlpool  PRECAUTIONS: None  RED FLAGS: None   WEIGHT BEARING RESTRICTIONS: No  FALLS:  Has patient fallen in last 6 months? 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  DIAGNOSTIC FINDINGS: None for R hip in Epic  PATIENT SURVEYS:  LEFS= 27/80=34% ability  COGNITION: Overall cognitive status: Within functional limits for tasks assessed     SENSATION: WFL  EDEMA:  Not observed  MUSCLE LENGTH: Hamstrings: Right WNLs deg; Left WNLs deg Andy Bannister test: Right WNLs deg; Left WNLs deg  POSTURE: rounded shoulders, forward head, and flexed trunk   PALPATION: TTP R anterior hip medial to ASIS  LOWER EXTREMITY ROM:  Active ROM Right eval Left eval  Hip flexion    Hip extension    Hip abduction    Hip adduction    Hip internal rotation decreased   Hip external rotation    Knee flexion    Knee extension    Ankle dorsiflexion    Ankle plantarflexion     Ankle inversion    Ankle eversion     (Blank rows = not tested)  LOWER EXTREMITY MMT:  MMT Right eval Left eval  Hip flexion 3 3  Hip extension 3 3  Hip abduction 3 3  Hip adduction    Hip internal rotation    Hip external rotation 3 3  Knee flexion 4 4  Knee extension 4 4  Ankle dorsiflexion    Ankle plantarflexion    Ankle inversion    Ankle eversion     (Blank rows = not tested)  LOWER EXTREMITY SPECIAL TESTS:  Hip special tests: Portia Brittle (FABER) test: negative and Thomas test: negative  FUNCTIONAL TESTS:  5 times sit to stand: 15.4 c use of hands Single leg balance=TBA  GAIT: Distance walked: 200' Assistive device utilized: None Level of assistance: Complete Independence Comments: Initial limp for approx first 20' of walking and then limp decreases                                                                                                                                TREATMENT DATE:  Renaldo Caroli Adult PT Treatment:                                                DATE: 05/21/24 Therapeutic Exercise: Developed, instructed in, and pt completed therex as noted in HEP  Self Care: Rationale for exercise, use of pillows under knees when sleeping supine     PATIENT EDUCATION:  Education details: Eval findings, POC, HEP, self care  Person educated: Patient Education method: Explanation, Demonstration, Tactile cues, Verbal cues, and Handouts Education comprehension: verbalized understanding, returned demonstration, verbal cues required, and tactile cues required  HOME EXERCISE PROGRAM: Access Code: [email or web address] URL: [email or web address] Date: 05/21/2024 Prepared by: Liborio Reeds  Exercises - Supine Hip Flexion with Anchored Resistance  - 1 x daily - 7 x weekly - 3 sets - 10 reps - 2 hold  ASSESSMENT:  CLINICAL IMPRESSION: Patient is a 68 y.o. female who was seen today for physical therapy evaluation and treatment for M25.551 (ICD-10-CM) - Right hip  pain. Pt presents with R anterior hip pain which appears consistent with kip flexor tendinopathy/iliopsosas bursitis. Both legs  demonstrate weakness. A HEP was initiated for hip flexor strengthening each LE. Pt will benefit from skilled PT 2w6 to address impairments to optimize LE function with less pain.   OBJECTIVE IMPAIRMENTS: decreased activity tolerance, decreased balance, decreased mobility, difficulty walking, decreased ROM, decreased strength, obesity, and pain.   ACTIVITY LIMITATIONS: standing, squatting, sleeping, stairs, and locomotion level  PARTICIPATION LIMITATIONS: meal prep, cleaning, and laundry  PERSONAL FACTORS: Fitness, Past/current experiences, Time since onset of injury/illness/exacerbation, and 1 comorbidity: high BMI are also affecting patient's functional outcome.   REHAB POTENTIAL: Good  CLINICAL DECISION MAKING: Evolving/moderate complexity  EVALUATION COMPLEXITY: Moderate   GOALS:  SHORT TERM GOALS: Target date: 06/05/24 Pt will be Ind in an initial HEP  Baseline: started Goal status: INITIAL  2.  Pt will voice understanding of measures to assist in pain reduction  Baseline: HEP Goal status: INITIAL   LONG TERM GOALS: Target date: 07/10/24  Pt will be Ind in a final HEP to maintain achieved LOF  Baseline:  Goal status: INITIAL  2.  Increase billat hip strength to 4 and knee strength to 4+ for improved function Baseline:  Goal status: INITIAL  3.  Improve 5xSTS by 3 c use of hands as indication of improved functional mobility  Baseline: 12.4 c use of hands Goal status: INITIAL  4.  Pt will report 50% or greater improvement with R hip pain for improved function and QOL Baseline: 8/10 intermittent Goal status: INITIAL  5.  Pt's LEFS score will improved by the MCID to 47% as indication of improved function  Baseline: 34% ability Goal status: INITIAL  6.  Pt will be able to asd/dsc steps in her home with minimal hip pain to access her arts and  crafts room Baseline: limited attempts to access Goal status: INITIAL  7. Pt will demonstrate SL stance balance of the R LE with 90% of the L     Baseline: TBA Goal Status: INITIAL   PLAN:  PT FREQUENCY: 2x/week  PT DURATION: 2 weeks  PLANNED INTERVENTIONS: 97164- PT Re-evaluation, 97110-Therapeutic exercises, 97530- Therapeutic activity, 97112- Neuromuscular re-education, 97535- Self Care, 16109- Manual therapy, [phone]- Gait training, [phone]- Aquatic Therapy, [phone]- Ionotophoresis 4mg /ml Dexamethasone, 29562 (1-2 muscles), 20561 (3+ muscles)- Dry Needling, Patient/Family education, Balance training, Stair training, Taping, Joint mobilization, Cryotherapy, and Moist heat  PLAN FOR NEXT SESSION: Assess single stance balance; assess response to HEP; progress therex as indicated; use of modalities, manual therapy; and TPDN as indicated.   Leronda Lewers MS, PT 05/22/24 9:02 AM

## 2024-05-21 ENCOUNTER — Ambulatory Visit

## 2024-05-21 ENCOUNTER — Other Ambulatory Visit: Payer: Self-pay

## 2024-05-21 DIAGNOSIS — R262 Difficulty in walking, not elsewhere classified: Secondary | ICD-10-CM | POA: Insufficient documentation

## 2024-05-21 DIAGNOSIS — M25551 Pain in right hip: Secondary | ICD-10-CM | POA: Insufficient documentation

## 2024-05-21 DIAGNOSIS — M6281 Muscle weakness (generalized): Secondary | ICD-10-CM | POA: Insufficient documentation

## 2024-05-24 NOTE — Therapy (Signed)
 OUTPATIENT PHYSICAL THERAPY LOWER EXTREMITY TREATMENT   Patient Name: Susan Sandoval MRN: [id number] DOB:1956-07-25, 68 y.o., female Today's Date: 05/26/2024  END OF SESSION:  PT End of Session - 05/26/24 1640     Visit Number 2    Number of Visits 13    Date for PT Re-Evaluation 07/10/24    Authorization Type MEDICARE PART A AND B;  [employer] PPO    PT Start Time 1630    PT Stop Time 1715    PT Time Calculation (min) 45 min    Activity Tolerance Patient tolerated treatment well    Behavior During Therapy WFL for tasks assessed/performed           Past Medical History:  Diagnosis Date   Diabetes mellitus without complication (HCC)    Hyperlipidemia    Hypertension    Past Surgical History:  Procedure Laterality Date   CESAREAN SECTION     CHOLECYSTECTOMY     EYE SURGERY     childhood   SPINE SURGERY     discectomy/ laminectomy L4-L5   WRIST SURGERY Right    Patient Active Problem List   Diagnosis Date Noted   Right hip pain 05/07/2024   SVT (supraventricular tachycardia) (HCC) 02/03/2024   Elevated LDL cholesterol level 10/01/2023   Right shoulder pain 12/31/2022   Insomnia 01/30/2022   Hyperlipidemia associated with type 2 diabetes mellitus (HCC) 03/27/2021   Graves disease 06/16/2020   Psoriasis 05/21/2020   Hypertension associated with type 2 diabetes mellitus (HCC) 12/10/2019   GAD (generalized anxiety disorder) 12/10/2019   Type 2 diabetes mellitus with diabetic neuropathy, unspecified (HCC) 12/09/2019    PCP: Gayle Saddie FALCON, PA-C   REFERRING PROVIDER: Gayle Saddie FALCON, PA-C   REFERRING DIAG: [phone] (ICD-10-CM) - Right hip pain   THERAPY DIAG:  Pain in right hip  Muscle weakness (generalized)  Difficulty in walking, not elsewhere classified  Rationale for Evaluation and Treatment: Rehabilitation  ONSET DATE: 3 months  SUBJECTIVE:   SUBJECTIVE STATEMENT: Pt reports the anterior R hip pain has resolved, but she has developed lateral  hip pain.  EVAL: Pt reports she injured her pelvis/hips when 3 months ago cleaning around the baseboard behind her toilet and she pressed against her pelvis against the front of the toilet. When she stood up, she experienced R anterior hip pain. Since this initial inicident, the pain has become worse, and she is also experience pain to a lesser degree of the her L anterior hip. She endorses weakness of both her legs. Pt notes after receiving a cortisone injection for her R shoulder her hip pain resolved for a period of time.   PERTINENT HISTORY: DM2  PAIN:  Are you having pain? Yes: NPRS scale: 5/10 Pain location: R anterior hip > L Pain description: sharp, ache, intermittent Aggravating factors: Sitting to standing, initial walking, , turning when walking Relieving factors: Rest, whirlpool  PRECAUTIONS: None  RED FLAGS: None   WEIGHT BEARING RESTRICTIONS: No  FALLS:  Has patient fallen in last 6 months? No  LIVING ENVIRONMENT: Lives with: lives with their family Lives in: House/apartment Able to access home  OCCUPATION: Retired; [employer]- room in home is upstairs  PLOF: Independent  PATIENT GOALS: Pain relief, for legs to be stronger  NEXT MD VISIT: 06/18/24 Saddie Gayle PA-C  OBJECTIVE:  Note: Objective measures were completed at Evaluation unless otherwise noted.  DIAGNOSTIC FINDINGS: None for R hip in Epic  PATIENT SURVEYS:  LEFS= 27/80=34% ability  COGNITION: Overall cognitive status: Within functional limits for tasks assessed     SENSATION: WFL  EDEMA:  Not observed  MUSCLE LENGTH: Hamstrings: Right WNLs deg; Left WNLs deg Debby test: Right WNLs deg; Left WNLs deg  POSTURE: rounded shoulders, forward head, and flexed trunk   PALPATION: TTP R anterior hip medial to ASIS  LOWER EXTREMITY ROM:  Active ROM Right eval Left eval  Hip flexion    Hip extension    Hip abduction    Hip adduction    Hip internal rotation decreased   Hip  external rotation    Knee flexion    Knee extension    Ankle dorsiflexion    Ankle plantarflexion    Ankle inversion    Ankle eversion     (Blank rows = not tested)  LOWER EXTREMITY MMT:  MMT Right eval Left eval  Hip flexion 3 3  Hip extension 3 3  Hip abduction 3 3  Hip adduction    Hip internal rotation    Hip external rotation 3 3  Knee flexion 4 4  Knee extension 4 4  Ankle dorsiflexion    Ankle plantarflexion    Ankle inversion    Ankle eversion     (Blank rows = not tested)  LOWER EXTREMITY SPECIAL TESTS:  Hip special tests: Belvie (FABER) test: negative and Thomas test: negative  FUNCTIONAL TESTS:  5 times sit to stand: 15.4 c use of hands Single leg balance=TBA  GAIT: Distance walked: 200' Assistive device utilized: None Level of assistance: Complete Independence Comments: Initial limp for approx first 20' of walking and then limp decreases                                                                                                                                TREATMENT DATE:  [facility] Adult PT Treatment:                                                DATE: 05/26/24 Therapeutic Exercise: Supine hamstring stretch c strap 2x 30 each Supine hamstring stretch c strap 2x 30 each H/L SL clams x10 GTB Bridging c abd set x10 GTB H/L hip add sets ball squeezes x10 Supine hip flexion RTB Updated HEP- pt is to complete every other day  .[facility] Adult PT Treatment:                                                DATE: 05/21/24 Therapeutic Exercise: Developed, instructed in, and pt completed therex as noted in HEP  Self Care: Rationale for exercise, use of pillows under knees when sleeping supine     PATIENT EDUCATION:  Education  details: Eval findings, POC, HEP, self care  Person educated: Patient Education method: Explanation, Demonstration, Tactile cues, Verbal cues, and Handouts Education comprehension: verbalized understanding, returned demonstration,  verbal cues required, and tactile cues required  HOME EXERCISE PROGRAM: Access Code: [email or web address] URL: [email or web address] Date: 05/26/2024 Prepared by: Dasie Daft  Exercises - Supine Hip Flexion with Anchored Resistance  - 1 x daily - 7 x weekly - 1-2 sets - 10 reps - 2 hold - Hooklying Isometric Clamshell  - 1 x daily - 7 x weekly - 1-2 sets - 10 reps - 3 hold - Supine Bridge with Resistance Band  - 1 x daily - 7 x weekly - 1-2 sets - 10 reps - 3 hold - Supine Hip Adduction Isometric with Ball  - 1 x daily - 7 x weekly - 1-2 sets - 10 reps - 3 hold - Supine Hamstring Stretch with Strap  - 1 x daily - 7 x weekly - 1 sets - 2-3 reps - 30 hold - Supine ITB Stretch with Strap  - 1 x daily - 7 x weekly - 1 sets - 2-3 reps - 30 hold  ASSESSMENT:  CLINICAL IMPRESSION: Pt's R anterior hip pain has improved, but pt developed discomfort of the lateral hips seeming related to the TFLs. PT was completed for hip strengthening and flexibility. A HEP was developed with pt to completed everyday and initially for 10 reps/1 set with the strengthening exercises. Pt is to progress to 2 sets on 6/12 if she tolerates her current program. Pt voiced understanding of her HEP. Pt tolerated PT today without adverse effects.     EVAL: Patient is a 68 y.o. female who was seen today for physical therapy evaluation and treatment for M25.551 (ICD-10-CM) - Right hip pain. Pt presents with R anterior hip pain which appears consistent with hip flexor tendinopathy/iliopsosas bursitis. Both legs demonstrate weakness. A HEP was initiated for hip flexor strengthening each LE. Pt will benefit from skilled PT 2w6 to address impairments to optimize LE function with less pain.   OBJECTIVE IMPAIRMENTS: decreased activity tolerance, decreased balance, decreased mobility, difficulty walking, decreased ROM, decreased strength, obesity, and pain.   ACTIVITY LIMITATIONS: standing, squatting, sleeping, stairs, and locomotion  level  PARTICIPATION LIMITATIONS: meal prep, cleaning, and laundry  PERSONAL FACTORS: Fitness, Past/current experiences, Time since onset of injury/illness/exacerbation, and 1 comorbidity: high BMI are also affecting patient's functional outcome.   REHAB POTENTIAL: Good  CLINICAL DECISION MAKING: Evolving/moderate complexity  EVALUATION COMPLEXITY: Moderate   GOALS:  SHORT TERM GOALS: Target date: 06/05/24 Pt will be Ind in an initial HEP  Baseline: started Goal status: INITIAL  2.  Pt will voice understanding of measures to assist in pain reduction  Baseline: HEP Goal status: INITIAL   LONG TERM GOALS: Target date: 07/10/24  Pt will be Ind in a final HEP to maintain achieved LOF  Baseline:  Goal status: INITIAL  2.  Increase billat hip strength to 4 and knee strength to 4+ for improved function Baseline:  Goal status: INITIAL  3.  Improve 5xSTS by 3 c use of hands as indication of improved functional mobility  Baseline: 12.4 c use of hands Goal status: INITIAL  4.  Pt will report 50% or greater improvement with R hip pain for improved function and QOL Baseline: 8/10 intermittent Goal status: INITIAL  5.  Pt's LEFS score will improved by the MCID to 47% as indication of improved function  Baseline: 34% ability Goal status: INITIAL  6.  Pt will be able to asd/dsc steps in her home with minimal hip pain to access her arts and crafts room Baseline: limited attempts to access Goal status: INITIAL  7. Pt will demonstrate SL stance balance of the R LE with 90% of the L     Baseline: TBA Goal Status: INITIAL   PLAN:  PT FREQUENCY: 2x/week  PT DURATION: 2 weeks  PLANNED INTERVENTIONS: 97164- PT Re-evaluation, 97110-Therapeutic exercises, 97530- Therapeutic activity, 97112- Neuromuscular re-education, 97535- Self Care, 02859- Manual therapy, [phone]- Gait training, [phone]- Aquatic Therapy, [phone]- Ionotophoresis 4mg /ml Dexamethasone, 79439 (1-2 muscles), 20561 (3+  muscles)- Dry Needling, Patient/Family education, Balance training, Stair training, Taping, Joint mobilization, Cryotherapy, and Moist heat  PLAN FOR NEXT SESSION: Assess single stance balance; assess response to HEP; progress therex as indicated; use of modalities, manual therapy; and TPDN as indicated.   Sheila Gervasi MS, PT 05/26/24 5:42 PM

## 2024-05-26 ENCOUNTER — Ambulatory Visit

## 2024-05-26 ENCOUNTER — Ambulatory Visit: Payer: Self-pay

## 2024-05-26 DIAGNOSIS — M6281 Muscle weakness (generalized): Secondary | ICD-10-CM

## 2024-05-26 DIAGNOSIS — M25551 Pain in right hip: Secondary | ICD-10-CM | POA: Diagnosis not present

## 2024-05-26 DIAGNOSIS — R262 Difficulty in walking, not elsewhere classified: Secondary | ICD-10-CM

## 2024-05-26 NOTE — Telephone Encounter (Signed)
 FYI Only or Action Required?: Action required by provider  Patient was last seen in primary care on 05/07/2024 by Laneta Pintos, MD. Called Nurse Triage reporting Neck Pain. Symptoms began several months ago. Interventions attempted: Rest, hydration, or home remedies. Symptoms are: unchanged. Neck and shoulder ain.  Triage Disposition: See PCP When Office is Open (Within 3 Days) Asking to be worked in. Patient/caregiver understands and will follow disposition?: Yes    Copied from CRM [id number]. Topic: Clinical - Red Word Triage >> May 26, 2024 12:05 PM Susan Sandoval wrote: Red Word that prompted transfer to Nurse Triage: patient wants to schedule appt for  pain neck,shoulders and hips, pain moves around  pain is very bad at the base of neck Reason for Disposition  Neck pain present > 2 weeks  Answer Assessment - Initial Assessment Questions 1. ONSET: When did the pain begin?      2-3 months 2. LOCATION: Where does it hurt?      neck 3. PATTERN Does the pain come and go, or has it been constant since it started?      Sandoval mand goes 4. SEVERITY: How bad is the pain?  (Scale 1-10; or mild, moderate, severe)   - NO PAIN (0): no pain or only slight stiffness    - MILD (1-3): doesn't interfere with normal activities    - MODERATE (4-7): interferes with normal activities or awakens from sleep    - SEVERE (8-10):  excruciating pain, unable to do any normal activities      moderate 5. RADIATION: Does the pain go anywhere else, shoot into your arms?     Shoulders 6. CORD SYMPTOMS: Any weakness or numbness of the arms or legs?     no 7. CAUSE: What do you think is causing the neck pain?     unsure 8. NECK OVERUSE: Any recent activities that involved turning or twisting the neck?     No 9. OTHER SYMPTOMS: Do you have any other symptoms? (e.g., headache, fever, chest pain, difficulty breathing, neck swelling)     No 10. PREGNANCY: Is there any chance you are pregnant? When  was your last menstrual period?       no  Protocols used: Neck Pain or Stiffness-A-AH

## 2024-05-29 ENCOUNTER — Ambulatory Visit: Payer: Self-pay

## 2024-05-29 ENCOUNTER — Other Ambulatory Visit: Payer: Self-pay

## 2024-05-29 MED ORDER — METHYLPREDNISOLONE 4 MG PO TBPK: ORAL_TABLET | ORAL | 0 refills | Status: DC

## 2024-05-29 NOTE — Progress Notes (Signed)
 Spoke with patient by phone regarding ongoing diffuse musculoskeletal pain primarily in upper arms and upper legs/hip girdle. She reports worsening aching, stiffness, and soreness, especially in the mornings, with some improvement when taking celecoxib . No fevers, chills, weakness, or other systemic symptoms. Pain now significantly affecting daily functioning. She voiced concern about the possibility of polymyalgia rheumatica (PMR) and requested prednisone. No signs or symptoms concerning for infection or other contraindications to steroid use. No reported fevers. Denied recent illness or concerning exposures. Kidney function normal on recent labs.    Assessment:  -Suspect polymyalgia rheumatica vs inflammatory arthritis vs mechanical causes.  -Trial of short-term prednisone appropriate for symptomatic relief and potential diagnostic clarity.   Plan:  Sent Medrol  dose pack to pharmacy for symptom management. Advised patient to not combine this medication with the Celebrex . Office will call to schedule in-person follow-up within 1 week.  Plan to evaluate response to steroids.  Will order ESR, CRP, CBC, CMP at that time.  Consider rheumatology referral pending clinical course.  Odilia Bennett, PA-C

## 2024-05-29 NOTE — Telephone Encounter (Signed)
 Copied from CRM [id number]. Topic: Clinical - Red Word Triage >> May 29, 2024  9:54 AM Elle L wrote: Red Word that prompted transfer to Nurse Triage: The patient is in a severe amount of pain. The patient states she was triaged on 6/17 and has not been contacted back and the pain is worsening.    Reason for Disposition  [1] SEVERE pain (e.g., excruciating, unable to do any normal activities) AND [2] not improved after 2 hours of pain medicine  Answer Assessment - Initial Assessment Questions No appointments available. Patient requesting someone from the office to call back to see about an earlier appointment than she has currently scheduled if possible for her pain, or for some treatment to help her until her upcoming appointment. Please advise. Sending HP due to the patient not hearing back from her request on 6/17      1. LOCATION and RADIATION: Where is the pain located?      Hip pain 3. SEVERITY: How bad is the pain? What does it keep you from doing?   (Scale 1-10; or mild, moderate, severe)   -  MILD (1-3): doesn't interfere with normal activities    -  MODERATE (4-7): interferes with normal activities (e.g., work or school) or awakens from sleep, limping    -  SEVERE (8-10): excruciating pain, unable to do any normal activities, unable to walk     Moderate to severe  4. ONSET: When did the pain start? Does it come and go, or is it there all the time?     2-3 months ago  5. WORK OR EXERCISE: Has there been any recent work or exercise that involved this part of the body?      No 6. CAUSE: What do you think is causing the hip pain?      Previous injury  7. AGGRAVATING FACTORS: What makes the hip pain worse? (e.g., walking, climbing stairs, running)     Walking 8. OTHER SYMPTOMS: Do you have any other symptoms? (e.g., back pain, pain shooting down leg,  fever, rash)     Shoulder pain, neck pain  Protocols used: Hip Pain-A-AH    FYI Only or Action Required?:  Action required by provider: request for appointment.  Patient was last seen in primary care on 05/07/2024 by Laneta Pintos, MD. Called Nurse Triage reporting Hip Pain. Symptoms began several months ago. Interventions attempted: Other: Medication, injections, pysical therapy. Symptoms are: gradually worsening.  Triage Disposition: See HCP Within 4 Hours (Or PCP Triage)  Patient/caregiver understands and will follow disposition?: No, wishes to speak with PCP

## 2024-06-03 ENCOUNTER — Ambulatory Visit (INDEPENDENT_AMBULATORY_CARE_PROVIDER_SITE_OTHER)

## 2024-06-03 VITALS — BP 139/84 | HR 80 | Ht 68.0 in | Wt 180.1 lb

## 2024-06-03 DIAGNOSIS — Z13828 Encounter for screening for other musculoskeletal disorder: Secondary | ICD-10-CM

## 2024-06-03 DIAGNOSIS — N950 Postmenopausal bleeding: Secondary | ICD-10-CM | POA: Diagnosis not present

## 2024-06-03 DIAGNOSIS — M25551 Pain in right hip: Secondary | ICD-10-CM

## 2024-06-03 DIAGNOSIS — M25511 Pain in right shoulder: Secondary | ICD-10-CM

## 2024-06-03 MED ORDER — PREDNISONE 10 MG PO TABS: 10.0000 mg | ORAL_TABLET | Freq: Every day | ORAL | 0 refills | Status: DC

## 2024-06-03 MED ORDER — PREDNISONE 5 MG PO TABS: 5.0000 mg | ORAL_TABLET | Freq: Every day | ORAL | 0 refills | Status: DC

## 2024-06-03 NOTE — Progress Notes (Signed)
 Established Patient Office Visit  Subjective   Patient ID: Susan Sandoval, female    DOB: 05-Dec-1956  Age: 68 y.o. MRN: [id number]  Chief Complaint  Patient presents with   Hip Pain    Onset:  Meds Taken:    HPI  Susan Sandoval is a 68 y.o. y/o female who presents to the clinic today for follow up on bilateral shoulder and hip girdle pain. Patient was seen by me on 05/07/24 and was treated with a course of celebrex  for chronic right shoulder pain and acute bilateral hip/groin pain. She saw Dr. Addie on 05/11/24 and got a right shoulder steroid injection then which she reports did help her pain for about 5 days, then wore off. When the injection wore off, patient reports that she woke up and rapidly began to experience bilateral shoulder and hip girdle pain that prevented her from walking. Reports that this came out of no where noted that the Celebrex  was not helping the pain at all. Denies fevers or swelling. No known injuries that would have exacerbated the symptoms. Patient then called the office to speak with PCP on 05/29/24 where patient voiced concern that given the acute onset on the symptoms and the fact that they are localized to shoulder and hip girdle with specific trigger points, she was concerned for polymyalgia rheumatica and would like to do a trial of steroids. At that time, patient was instructed to stop taking the Celebrex  and was started on a Medrol  dosing pack and scheduled for a follow up appointment today.   Patient reports that she noticed immediate relief with the steroids on day 1, 2, and 3. Reports that mild pain in her shoulders and hips started to surface on days 4 and 5. She has 1 day of medication left for tomorrow. Denies side effects other than mild GI upset from the higher dose of medication on day 1. Otherwise doing well and mobility is much improved today.  Patient also wanted to mention that about 2 weeks ago, she noticed some brown/red vaginal discharge. This happened  2-3 days after her steroid injection. Reports that it was light and lasted about 3 days and then has not returned. Denies pelvic pain or other associated symptoms. Pt still has her uterus. She wanted to make mention of it so that it was documented in her chart.     ROS Per HPI.    Objective:     BP 139/84   Pulse 80   Ht 5' 8 (1.727 m)   Wt 180 lb 1.9 oz (81.7 kg)   SpO2 98%   BMI 27.39 kg/m    Physical Exam Constitutional:      General: She is not in acute distress.    Appearance: Normal appearance.   Cardiovascular:     Rate and Rhythm: Normal rate and regular rhythm.     Heart sounds: Normal heart sounds. No murmur heard.    No friction rub. No gallop.  Pulmonary:     Effort: Pulmonary effort is normal. No respiratory distress.     Breath sounds: Normal breath sounds.   Musculoskeletal:        General: No swelling.     Right shoulder: Tenderness present. Decreased range of motion.     Left shoulder: Tenderness present.     Right hip: Normal.     Left hip: Normal.   Skin:    General: Skin is warm and dry.   Neurological:     General:  No focal deficit present.     Mental Status: She is alert.   Psychiatric:        Mood and Affect: Mood normal.        Behavior: Behavior normal.        Thought Content: Thought content normal.    No results found for any visits on 06/03/24.    The 10-year ASCVD risk score (Arnett DK, et al., 2019) is: 22.1%    Assessment & Plan:   Encounter for special screening examination for musculoskeletal disorder Assessment & Plan: Given insidious onset of symptoms combined with fact that pain is localized to shoulder and hip girdle and the patient responded well to a steroid taper, will continue workup for polymyalgia rheumatica (PMR). See orders for labs ordered today. Prescribed 15 mg daily maintenance dose of prednisone for now while patient is being worked up for diagnosis. Discussed with patient that PMR is a diagnosis of  exclusion, however the labs ordered today may help us  to support diagnosis of PMR. If any of the labs today come back positive, will place referral to rheumatology for further recommendations and management.  Orders: -     Sedimentation rate -     C-reactive protein -     Cyclic citrul peptide antibody, IgG -     Rheumatoid factor -     ANA  Postmenopausal bleeding Assessment & Plan: Although bleeding has resolved, have placed order for a transvaginal ultrasound today to further evaluate causes of the vaginal bleeding. OB/GYN referral placed in anticipation of the transvaginal ultrasound results. Will continue to monitor.   Other orders -     predniSONE; Take 1 tablet (10 mg total) by mouth daily with breakfast.  Dispense: 30 tablet; Refill: 0 -     predniSONE; Take 1 tablet (5 mg total) by mouth daily with breakfast.  Dispense: 30 tablet; Refill: 0    Return in about 3 weeks (around 06/24/2024) for Lab follow up (PMR).    Susan JULIANNA Sacks, PA-C

## 2024-06-03 NOTE — Therapy (Signed)
 OUTPATIENT PHYSICAL THERAPY LOWER EXTREMITY TREATMENT/DCSummary   Patient Name: Susan Sandoval MRN: [id number] DOB:09/20/1956, 68 y.o., female Today's Date: 06/04/2024  END OF SESSION:  PT End of Session - 06/04/24 1546     Visit Number 3    Number of Visits 13    Date for PT Re-Evaluation 07/10/24    Authorization Type MEDICARE PART A AND B;  [employer] PPO    PT Start Time 1501    PT Stop Time 1542    PT Time Calculation (min) 41 min    Activity Tolerance Patient tolerated treatment well    Behavior During Therapy WFL for tasks assessed/performed            Past Medical History:  Diagnosis Date   Diabetes mellitus without complication (HCC)    Hyperlipidemia    Hypertension    Past Surgical History:  Procedure Laterality Date   CESAREAN SECTION     CHOLECYSTECTOMY     EYE SURGERY     childhood   SPINE SURGERY     discectomy/ laminectomy L4-L5   WRIST SURGERY Right    Patient Active Problem List   Diagnosis Date Noted   Encounter for special screening examination for musculoskeletal disorder 06/03/2024   Postmenopausal bleeding 06/03/2024   Right hip pain 05/07/2024   SVT (supraventricular tachycardia) (HCC) 02/03/2024   Elevated LDL cholesterol level 10/01/2023   Right shoulder pain 12/31/2022   Insomnia 01/30/2022   Hyperlipidemia associated with type 2 diabetes mellitus (HCC) 03/27/2021   Graves disease 06/16/2020   Psoriasis 05/21/2020   Hypertension associated with type 2 diabetes mellitus (HCC) 12/10/2019   GAD (generalized anxiety disorder) 12/10/2019   Type 2 diabetes mellitus with diabetic neuropathy, unspecified (HCC) 12/09/2019    PCP: [name] FALCON, PA-C   REFERRING PROVIDER: Gayle Susan FALCON, PA-C   REFERRING DIAG: [phone] (ICD-10-CM) - Right hip pain   THERAPY DIAG:  Pain in right hip  Muscle weakness (generalized)  Difficulty in walking, not elsewhere classified  Rationale for Evaluation and Treatment: Rehabilitation  ONSET  DATE: 3 months  SUBJECTIVE:   SUBJECTIVE STATEMENT: Pt reports she talked to to Susan Sandoval, [place] about the possibility of her having PMR and she was started on a prednisone dose pack and she is curently not having pain, her legs are stronger, and she is functional at her prior baseline level for her legs.  EVAL: Pt reports she injured her pelvis/hips when 3 months ago cleaning around the baseboard behind her toilet and she pressed against her pelvis against the front of the toilet. When she stood up, she experienced R anterior hip pain. Since this initial inicident, the pain has become worse, and she is also experience pain to a lesser degree of the her L anterior hip. She endorses weakness of both her legs. Pt notes after receiving a cortisone injection for her R shoulder her hip pain resolved for a period of time.   PERTINENT HISTORY: DM2  PAIN:  Are you having pain? Yes: NPRS scale: 0/10 Pain location: R anterior hip > L Pain description: sharp, ache, intermittent Aggravating factors: Sitting to standing, initial walking, , turning when walking Relieving factors: Rest, whirlpool  PRECAUTIONS: None  RED FLAGS: None   WEIGHT BEARING RESTRICTIONS: No  FALLS:  Has patient fallen in last 6 months? No  LIVING ENVIRONMENT: Lives with: lives with their family Lives in: House/apartment Able to access home  OCCUPATION: Retired; [employer]- room in home is upstairs  PLOF:  Independent  PATIENT GOALS: Pain relief, for legs to be stronger  NEXT MD VISIT: 06/18/24 Susan Sacks PA-C  OBJECTIVE:  Note: Objective measures were completed at Evaluation unless otherwise noted.  DIAGNOSTIC FINDINGS: None for R hip in Epic  PATIENT SURVEYS:  LEFS= 27/80=34% ability  COGNITION: Overall cognitive status: Within functional limits for tasks assessed     SENSATION: WFL  EDEMA:  Not observed  MUSCLE LENGTH: Hamstrings: Right WNLs deg; Left WNLs deg Debby test: Right WNLs deg; Left  WNLs deg  POSTURE: rounded shoulders, forward head, and flexed trunk   PALPATION: TTP R anterior hip medial to ASIS  LOWER EXTREMITY ROM:  Active ROM Right eval Left eval  Hip flexion    Hip extension    Hip abduction    Hip adduction    Hip internal rotation decreased   Hip external rotation    Knee flexion    Knee extension    Ankle dorsiflexion    Ankle plantarflexion    Ankle inversion    Ankle eversion     (Blank rows = not tested)  LOWER EXTREMITY MMT:  MMT Right eval Left eval  Hip flexion 3 3  Hip extension 3 3  Hip abduction 3 3  Hip adduction    Hip internal rotation    Hip external rotation 3 3  Knee flexion 4 4  Knee extension 4 4  Ankle dorsiflexion    Ankle plantarflexion    Ankle inversion    Ankle eversion     (Blank rows = not tested)  LOWER EXTREMITY SPECIAL TESTS:  Hip special tests: Belvie (FABER) test: negative and Thomas test: negative  FUNCTIONAL TESTS:  5 times sit to stand: 15.4 c use of hands Single leg balance=TBA  GAIT: Distance walked: 200' Assistive device utilized: None Level of assistance: Complete Independence Comments: Initial limp for approx first 20' of walking and then limp decreases                                                                                                                                TREATMENT DATE:  [facility] Adult PT Treatment:                                                DATE: 06/04/24 Therapeutic Exercise: - Cervical Retraction at Wall 10 reps - 3 hold - Standing Shoulder Horizontal Abduction Star Pattern 5 reps - 3 hold RTB - Shoulder External Rotation and Scapular Retraction 10 reps - 3 hold YTB - Doorway Pec Stretch at 90 Degrees Abduction 2 reps - 30 hold - Seated Upper Trapezius Stretch  2 reps - 5 hold Updated HEP Self Care: Recommendations for improving posture and reducing postural strain per a HEP, support of arms, and tablet set up  [facility] Adult PT Treatment:  DATE: 05/26/24 Therapeutic Exercise: Supine hamstring stretch c strap 2x 30 each Supine hamstring stretch c strap 2x 30 each H/L SL clams x10 GTB Bridging c abd set x10 GTB H/L hip add sets ball squeezes x10 Supine hip flexion RTB Updated HEP- pt is to complete every other day  .[facility] Adult PT Treatment:                                                DATE: 05/21/24 Therapeutic Exercise: Developed, instructed in, and pt completed therex as noted in HEP  Self Care: Rationale for exercise, use of pillows under knees when sleeping supine     PATIENT EDUCATION:  Education details: Eval findings, POC, HEP, self care  Person educated: Patient Education method: Explanation, Demonstration, Tactile cues, Verbal cues, and Handouts Education comprehension: verbalized understanding, returned demonstration, verbal cues required, and tactile cues required  HOME EXERCISE PROGRAM: Access Code: [email or web address] URL: [email or web address] Date: 06/04/2024 Prepared by: Dasie Daft  Exercises - Supine Hip Flexion with Anchored Resistance  - 1 x daily - 7 x weekly - 1-2 sets - 10 reps - 2 hold - Hooklying Isometric Clamshell  - 1 x daily - 7 x weekly - 1-2 sets - 10 reps - 3 hold - Supine Bridge with Resistance Band  - 1 x daily - 7 x weekly - 1-2 sets - 10 reps - 3 hold - Supine Hip Adduction Isometric with Ball  - 1 x daily - 7 x weekly - 1-2 sets - 10 reps - 3 hold - Supine Hamstring Stretch with Strap  - 1 x daily - 7 x weekly - 1 sets - 2-3 reps - 30 hold - Supine ITB Stretch with Strap  - 1 x daily - 7 x weekly - 1 sets - 2-3 reps - 30 hold - Cervical Retraction at Wall  - 1 x daily - 7 x weekly - 1 sets - 10 reps - 3 hold - Standing Shoulder Horizontal Abduction with Resistance  - 1 x daily - 7 x weekly - 1-2 sets - 5 reps - 3 hold - Shoulder External Rotation and Scapular Retraction with Resistance  - 1 x daily - 7 x weekly - 1-2 sets - 10 reps - 3 hold -  Doorway Pec Stretch at 90 Degrees Abduction  - 1 x daily - 7 x weekly - 1 sets - 3 reps - 30 hold - Seated Upper Trapezius Stretch  - 1 x daily - 7 x weekly - 1 sets - 3 reps - 5 hold  ASSESSMENT:  CLINICAL IMPRESSION: Pt was prescribed a prednisone dose pack which has significantly improved her condition. The pt wants to stop therapy at this time to see how  her symptoms go. Pt plans to return to her HEP when the longer term response and status of her symptoms becomes more clear. Today, pt requested exercises to address her shoulder strength. A HEP was developed to address shoulder and neck mobility and for postural strengthening. Pt returned proper demonstration of these exs. Per pt's request she was Dced from PT. Pt is aware she may return to PT with a referral should she need services in the future.    EVAL: Patient is a 68 y.o. female who was seen today for physical therapy evaluation and treatment for M25.551 (ICD-10-CM) - Right hip pain. Pt presents with  R anterior hip pain which appears consistent with hip flexor tendinopathy/iliopsosas bursitis. Both legs demonstrate weakness. A HEP was initiated for hip flexor strengthening each LE. Pt will benefit from skilled PT 2w6 to address impairments to optimize LE function with less pain.   OBJECTIVE IMPAIRMENTS: decreased activity tolerance, decreased balance, decreased mobility, difficulty walking, decreased ROM, decreased strength, obesity, and pain.   ACTIVITY LIMITATIONS: standing, squatting, sleeping, stairs, and locomotion level  PARTICIPATION LIMITATIONS: meal prep, cleaning, and laundry  PERSONAL FACTORS: Fitness, Past/current experiences, Time since onset of injury/illness/exacerbation, and 1 comorbidity: high BMI are also affecting patient's functional outcome.   REHAB POTENTIAL: Good  CLINICAL DECISION MAKING: Evolving/moderate complexity  EVALUATION COMPLEXITY: Moderate   GOALS:  SHORT TERM GOALS: Target date: 06/05/24 Pt  will be Ind in an initial HEP  Baseline: started Goal status: INITIAL  2.  Pt will voice understanding of measures to assist in pain reduction  Baseline: HEP Goal status: INITIAL   LONG TERM GOALS: Target date: 07/10/24  Pt will be Ind in a final HEP to maintain achieved LOF  Baseline:  Goal status: MET  2.  Increase billat hip strength to 4 and knee strength to 4+ for improved function Baseline:  Goal status: MET  3.  Improve 5xSTS by 3 c use of hands as indication of improved functional mobility  Baseline: 12.4 c use of hands 06/04/24: 9.3 s hands Goal status: MET  4.  Pt will report 50% or greater improvement with R hip pain for improved function and QOL Baseline: 8/10 intermittent Goal status: MET per intervention of prednisone  5.  Pt's LEFS score will improved by the MCID to 47% as indication of improved function  Baseline: 34% ability Goal status: Not reassessed  6.  Pt will be able to asd/dsc steps in her home with minimal hip pain to access her arts and crafts room Baseline: limited attempts to access Goal status: INITIAL  7. Pt will demonstrate SL stance balance of the R LE with 90% of the L     Baseline: TBA Goal Status: INITIAL   PLAN:  PT FREQUENCY: 2x/week  PT DURATION: 2 weeks  PLANNED INTERVENTIONS: 97164- PT Re-evaluation, 97110-Therapeutic exercises, 97530- Therapeutic activity, 97112- Neuromuscular re-education, 97535- Self Care, 02859- Manual therapy, [phone]- Gait training, [phone]- Aquatic Therapy, [phone]- Ionotophoresis 4mg /ml Dexamethasone, 79439 (1-2 muscles), 20561 (3+ muscles)- Dry Needling, Patient/Family education, Balance training, Stair training, Taping, Joint mobilization, Cryotherapy, and Moist heat  PLAN FOR NEXT SESSION: Assess single stance balance; assess response to HEP; progress therex as indicated; use of modalities, manual therapy; and TPDN as indicated.  PHYSICAL THERAPY DISCHARGE SUMMARY  Visits from Start of Care: 3  Current  functional level related to goals / functional outcomes: See clinical impression and PT goals    Remaining deficits: See clinical impression and PT goals    Education / Equipment: HEP/Pt ED   Patient agrees to discharge. Patient goals were partially met. Patient is being discharged due to the patient's request.   Dasie Daft MS, PT 06/04/24 7:22 PM

## 2024-06-03 NOTE — Assessment & Plan Note (Signed)
 Given insidious onset of symptoms combined with fact that pain is localized to shoulder and hip girdle and the patient responded well to a steroid taper, will continue workup for polymyalgia rheumatica (PMR). See orders for labs ordered today. Prescribed 15 mg daily maintenance dose of prednisone for now while patient is being worked up for diagnosis. Discussed with patient that PMR is a diagnosis of exclusion, however the labs ordered today may help us  to support diagnosis of PMR. If any of the labs today come back positive, will place referral to rheumatology for further recommendations and management.

## 2024-06-03 NOTE — Assessment & Plan Note (Signed)
 Although bleeding has resolved, have placed order for a transvaginal ultrasound today to further evaluate causes of the vaginal bleeding. OB/GYN referral placed in anticipation of the transvaginal ultrasound results. Will continue to monitor.

## 2024-06-03 NOTE — Patient Instructions (Addendum)
 It was nice to see you today!  As we discussed in clinic:  -We will continue the prednisone at a 15 mg maintenance dose for now while we are working you up for PMR.  -I will plan to see you back in the office in around 3 weeks to discuss lab work and next steps for diagnosis!   It was good to see you again! Happy Birthday!   If you have any problems before your next visit feel free to message me via MyChart (minor issues or questions) or call the office, otherwise you may reach out to schedule an office visit.  Thank you! Saddie Sacks, PA-C

## 2024-06-04 ENCOUNTER — Ambulatory Visit

## 2024-06-04 DIAGNOSIS — R262 Difficulty in walking, not elsewhere classified: Secondary | ICD-10-CM

## 2024-06-04 DIAGNOSIS — M25551 Pain in right hip: Secondary | ICD-10-CM | POA: Diagnosis not present

## 2024-06-04 DIAGNOSIS — M6281 Muscle weakness (generalized): Secondary | ICD-10-CM

## 2024-06-04 LAB — SPECIMEN STATUS REPORT

## 2024-06-04 LAB — ANA: Anti Nuclear Antibody (ANA): NEGATIVE

## 2024-06-06 LAB — SEDIMENTATION RATE: Sed Rate: 4 mm/h (ref 0–40)

## 2024-06-06 LAB — RHEUMATOID FACTOR: Rheumatoid fact SerPl-aCnc: 10.7 [IU]/mL (ref <14.0)

## 2024-06-06 LAB — C-REACTIVE PROTEIN: CRP: <1 mg/L (ref 0–10)

## 2024-06-07 MED ORDER — PREDNISONE 10 MG PO TABS: 10.0000 mg | ORAL_TABLET | Freq: Two times a day (BID) | ORAL | 0 refills | Status: AC

## 2024-06-08 ENCOUNTER — Ambulatory Visit (INDEPENDENT_AMBULATORY_CARE_PROVIDER_SITE_OTHER): Admitting: Family Medicine

## 2024-06-08 ENCOUNTER — Encounter: Payer: Self-pay | Admitting: Family Medicine

## 2024-06-08 ENCOUNTER — Other Ambulatory Visit (HOSPITAL_COMMUNITY)
Admission: RE | Admit: 2024-06-08 | Discharge: 2024-06-08 | Disposition: A | Discharge status: Home / Self Care | Source: Ambulatory Visit | Attending: Family Medicine | Admitting: Family Medicine

## 2024-06-08 VITALS — BP 139/86 | HR 86 | Ht 68.0 in | Wt 182.8 lb

## 2024-06-08 DIAGNOSIS — F411 Generalized anxiety disorder: Secondary | ICD-10-CM | POA: Diagnosis not present

## 2024-06-08 DIAGNOSIS — Z113 Encounter for screening for infections with a predominantly sexual mode of transmission: Secondary | ICD-10-CM | POA: Diagnosis not present

## 2024-06-08 DIAGNOSIS — Z1151 Encounter for screening for human papillomavirus (HPV): Secondary | ICD-10-CM | POA: Diagnosis not present

## 2024-06-08 DIAGNOSIS — Z01411 Encounter for gynecological examination (general) (routine) with abnormal findings: Secondary | ICD-10-CM | POA: Insufficient documentation

## 2024-06-08 DIAGNOSIS — N950 Postmenopausal bleeding: Secondary | ICD-10-CM | POA: Insufficient documentation

## 2024-06-08 DIAGNOSIS — Z124 Encounter for screening for malignant neoplasm of cervix: Secondary | ICD-10-CM | POA: Insufficient documentation

## 2024-06-08 NOTE — Progress Notes (Signed)
 Had two days of brownish discharge about one month ago. Pt suspects it may have been her cholesterol meds. She stopped the meds and the bleeding stopped. Her for PAP. Pt PCP wants her to get checked out. Would like breast exam. Has had mammogram 11/2022. Had colonoscopy when she turned 50.   Pt scored 8 on the PHQ and 10 on the GAD. Pt declined referral stating she is working with so many people now, I dont need anymore.   No other concerns at this time.

## 2024-06-08 NOTE — Progress Notes (Signed)
 PROBLEM VISIT Patient name: Susan Sandoval MRN [id number]  Date of birth: 04-Sep-1956 Chief Complaint:   No chief complaint on file.  History of Present Illness:   Susan Sandoval is a 68 y.o. No obstetric history on file. female being seen today for   Postmenopausal bleeding: Reports isolated episode of spotting last month after starting on Zetia . First day, had scant brown discharge, on next day had increased brown discharge. No bright red blood. She stopped Zetia  and reports sxs had stopped. No VB since. Menopause in early 60s. Reports 2 term pregnancies, uncomplicated, and one SAB. No issues with irregular bleeding until last month. Has not had intercourse in approx 6 months, does report dyspareunia previously given vaginal dryness. No fhx gyn or breast cancer. Last pap 2017 in [place] , reports normal. Never had a pelvic ultrasound for any reason. Not on HRT. Has DM, on Metformin , well controlled, last A1c 5.9%. Not on anticoagulation. Endorses occ night sweats in summer. No fevers or chills, nausea, vomiting. Reports weight loss with recent chronic pain.  GAD:  Elevated score today. Previously on Zoloft , stopped taking. Not interested in treatment today.   No LMP recorded. Patient is postmenopausal.  Last pap 2017. Results were: NILM. H/O abnormal pap: no Last mammogram: 11/2022. Results were: normal. Family h/o breast cancer: no Last colonoscopy: 2007. Results were: normal. Has been doing fecal testing with PCP since, all wnl. Family h/o colorectal cancer: no     06/08/2024    2:33 PM 06/03/2024    4:14 PM 05/07/2024   10:16 AM 02/03/2024    1:25 PM 10/01/2023    9:53 AM  Depression screen PHQ 2/9  Decreased Interest 2 3 3 1 1   Down, Depressed, Hopeless 1 3 2  0 1  PHQ - 2 Score 3 6 5 1 2   Altered sleeping 2 3 1 1 1   Tired, decreased energy 3 3 3 1 1   Change in appetite 0 2 0 0 0  Feeling bad or failure about yourself  0 0 0 0 0  Trouble concentrating 0 0 0 0 0  Moving slowly or  fidgety/restless 0 1 3 0 0  Suicidal thoughts 0 0 0 0 0  PHQ-9 Score 8 15 12 3 4   Difficult doing work/chores  Extremely dIfficult Very difficult Not difficult at all Not difficult at all        06/08/2024    2:33 PM 06/03/2024    4:14 PM 05/07/2024   10:17 AM 02/03/2024    1:25 PM  GAD 7 : Generalized Anxiety Score  Nervous, Anxious, on Edge 3 1 2 1   Control/stop worrying 2 3 1 1   Worry too much - different things 2 3 2 1   Trouble relaxing 1 2 0 0  Restless 0 0 0 0  Easily annoyed or irritable 1 1 0 0  Afraid - awful might happen 1 3 0 1  Total GAD 7 Score 10 13 5 4   Anxiety Difficulty  Extremely difficult Not difficult at all Not difficult at all   Review of Systems:   Pertinent items are noted in HPI Denies any headaches, blurred vision, fatigue, shortness of breath, chest pain, abdominal pain, abnormal vaginal discharge/itching/odor/irritation, problems with periods, bowel movements, urination, or intercourse unless otherwise stated above. Pertinent History Reviewed:  Reviewed past medical,surgical, social and family history.  Reviewed problem list, medications and allergies. Physical Assessment:   Vitals:   06/08/24 1420  BP: 139/86  Pulse: 86  Weight: 182  lb 12.8 oz (82.9 kg)  Height: 5' 8 (1.727 m)  Body mass index is 27.79 kg/m.        Physical Examination:   General appearance - well appearing, and in no distress  Mental status - alert, oriented to person, place, and time  Psych:  She has a normal mood and affect  Skin - warm and dry, normal color, no suspicious lesions noted  Chest - effort normal, all lung fields clear to auscultation bilaterally  Heart - normal rate and regular rhythm  Neck:  midline trachea, no thyromegaly or nodules  Breasts - breasts appear normal, no suspicious masses, no skin or nipple changes or  axillary nodes  Abdomen - soft, nontender, nondistended, no masses or organomegaly  Pelvic - VULVA: normal appearing vulva with no masses,  tenderness or lesions  VAGINA: normal appearing vagina with normal color and discharge, no lesions  CERVIX: normal appearing cervix without discharge or lesions, no CMT  Thin prep pap is done with HR HPV cotesting  UTERUS: uterus is felt to be normal size, shape, consistency and nontender   ADNEXA: No adnexal masses or tenderness noted.  Extremities:  No swelling or varicosities noted  Chaperone present for exam  No results found for this or any previous visit (from the past 24 hours).  Assessment & Plan:  1) Postmenopausal bleeding  Discussed with patient first steps are pap and U/S. Vaginal swabs also collected. Patient with scheduled U/S on 7/3. Discussed that likely would need endometrial biopsy, pt desires to defer for today. Will obtain other info, recommend f/up in 6 weeks to schedule f/up for U/S results and possible EMB.   Labs/procedures today: Pap, swabs  Mammogram: schedule screening mammo as soon as possible, or sooner if problems Colonoscopy: @ 68yo, or sooner if problems  No orders of the defined types were placed in this encounter.   Meds: No orders of the defined types were placed in this encounter.   Follow-up: Return in about 6 weeks (around 07/20/2024) for F/up vaginal bleeding.  Alain Sor, MD 06/08/2024 3:22 PM

## 2024-06-08 NOTE — Telephone Encounter (Signed)
 Addressed patient concerns yesterday in a separate thread of messages.

## 2024-06-09 ENCOUNTER — Encounter

## 2024-06-09 LAB — SPECIMEN STATUS REPORT

## 2024-06-09 LAB — CYCLIC CITRUL PEPTIDE ANTIBODY, IGG/IGA: Cyclic Citrullin Peptide Ab: 9 U (ref 0–19)

## 2024-06-10 ENCOUNTER — Ambulatory Visit

## 2024-06-10 LAB — CYTOLOGY - PAP
Comment: NEGATIVE
Diagnosis: NEGATIVE
High risk HPV: NEGATIVE

## 2024-06-11 ENCOUNTER — Ambulatory Visit
Admission: RE | Admit: 2024-06-11 | Discharge: 2024-06-11 | Disposition: A | Discharge status: Home / Self Care | Source: Ambulatory Visit

## 2024-06-11 ENCOUNTER — Encounter

## 2024-06-11 DIAGNOSIS — N950 Postmenopausal bleeding: Secondary | ICD-10-CM

## 2024-06-11 LAB — CERVICOVAGINAL ANCILLARY ONLY
Bacterial Vaginitis (gardnerella): NEGATIVE
Candida Glabrata: NEGATIVE
Candida Vaginitis: NEGATIVE
Chlamydia: NEGATIVE
Comment: NEGATIVE
Comment: NEGATIVE
Comment: NEGATIVE
Comment: NEGATIVE
Comment: NEGATIVE
Comment: NORMAL
Neisseria Gonorrhea: NEGATIVE
Trichomonas: NEGATIVE

## 2024-06-15 ENCOUNTER — Other Ambulatory Visit: Payer: Self-pay

## 2024-06-15 ENCOUNTER — Ambulatory Visit: Payer: Self-pay

## 2024-06-15 MED ORDER — PREDNISONE 5 MG PO TABS
5.0000 mg | ORAL_TABLET | Freq: Every day | ORAL | 0 refills | Status: DC
Start: 2024-06-15 — End: 2024-06-22

## 2024-06-15 MED ORDER — PREDNISONE 20 MG PO TABS
20.0000 mg | ORAL_TABLET | Freq: Every day | ORAL | 0 refills | Status: DC
Start: 2024-06-15 — End: 2024-06-22

## 2024-06-16 ENCOUNTER — Ambulatory Visit: Payer: Self-pay | Admitting: Family Medicine

## 2024-06-16 ENCOUNTER — Encounter

## 2024-06-18 ENCOUNTER — Ambulatory Visit

## 2024-06-19 ENCOUNTER — Encounter

## 2024-06-22 ENCOUNTER — Ambulatory Visit (INDEPENDENT_AMBULATORY_CARE_PROVIDER_SITE_OTHER)

## 2024-06-22 VITALS — BP 124/76 | HR 97 | Temp 97.6°F | Ht 68.0 in | Wt 182.1 lb

## 2024-06-22 DIAGNOSIS — Z13828 Encounter for screening for other musculoskeletal disorder: Secondary | ICD-10-CM

## 2024-06-22 DIAGNOSIS — N950 Postmenopausal bleeding: Secondary | ICD-10-CM | POA: Diagnosis not present

## 2024-06-22 MED ORDER — PREDNISONE 2.5 MG PO TABS: ORAL_TABLET | ORAL | 0 refills | Status: DC

## 2024-06-22 MED ORDER — PREDNISONE 20 MG PO TABS: ORAL_TABLET | ORAL | 0 refills | Status: DC

## 2024-06-22 MED ORDER — PREDNISONE 10 MG PO TABS: ORAL_TABLET | ORAL | 0 refills | Status: DC

## 2024-06-22 MED ORDER — PREDNISONE 5 MG PO TABS: ORAL_TABLET | ORAL | 0 refills | Status: DC

## 2024-06-22 NOTE — Progress Notes (Signed)
 Established Patient Office Visit  Subjective   Patient ID: Susan Sandoval, female    DOB: [birth year]  Age: 68 y.o. MRN: [id number]  Chief Complaint  Patient presents with   Medical Management of Chronic Issues    Mood    HPI  Susan Sandoval is a 68 y.o. y/o female who presents to the clinic today for follow up on hip/shoulder girdle weakness/pain and postmenopausal bleeding.   Hip/Shoulder Girdle Pain: Working diagnosis of PMR. Patient reports that with the 25 mg of prednisone  daily, her pain level has improved to 2-3/10 in the AM, all the way down to 0/10 in the afternoons. She is doing her PT exercises at home daily. Still reports that in the mornings her shoulders and hips feel weak like she cannot support herself. This improves throughout the day. Autoimmune labs normal, however CRP/Sed rate were drawn after patient had been on prednisone , so could be falsely negative. Patient denies GI upset. Checking her sugar and BP every day as she is aware of the side effects from Prednisone  and brought her recording into the office today which were all normal. Patient was referred to [facility] at her las office visit in June, however they could not see her until Jan 2026. New referral placed last week to [facility] who can see her in 6-8 weeks.   Post-Menopausal vaginal bleeding: Patient saw OB/GYN several weeks ago who performed physical exam/TV ultrasound which showed no abnormalities/thickness of 4 cm. Pt's OB recommended getting endometrial biopsy given that thickness was on the cusp of abnormal. Patient verbalized today that with all she has going on with the work up for PMR + the fact that she has not had any other symptoms or epsidoes of vaginal bleeding, she would like to hold off on the biopsy but would notify me/her OB if she experiences any new symptoms or vaginal bleeding.    ROS Per HPI.    Objective:     BP 124/76   Pulse 97   Temp 97.6 F (36.4 C) (Oral)   Ht 5' 8  (1.727 m)   Wt 182 lb 1.9 oz (82.6 kg)   SpO2 97%   BMI 27.69 kg/m    Physical Exam Constitutional:      General: She is not in acute distress.    Appearance: Normal appearance.  Cardiovascular:     Rate and Rhythm: Normal rate and regular rhythm.     Heart sounds: Normal heart sounds. No murmur heard.    No friction rub. No gallop.  Pulmonary:     Effort: Pulmonary effort is normal. No respiratory distress.     Breath sounds: Normal breath sounds.  Musculoskeletal:        General: No swelling.  Skin:    General: Skin is warm and dry.  Neurological:     General: No focal deficit present.     Mental Status: She is alert.  Psychiatric:        Mood and Affect: Mood normal.        Behavior: Behavior normal.        Thought Content: Thought content normal.     No results found for any visits on 06/22/24.    The 10-year ASCVD risk score (Arnett DK, et al., 2019) is: 18%    Assessment & Plan:   Encounter for special screening examination for musculoskeletal disorder Assessment & Plan: Given insidious onset of symptoms combined with fact that pain is localized to shoulder and  hip girdle and the patient responded well to a steroid taper, will continue workup for polymyalgia rheumatica (PMR).  Autoimmune labs were normal. ESR/CRP normal, however patient was on a steroid when the labs were drawn, so potential for them to be falsely negative, however risk of stopping steroids to recheck labs much higher than continuing with steroid therapy.  Begin steroid taper today now that pain is improved after 25 mg prednisone  x 7 days. Will follow the following taper plan:  20 mg x 7 days  17.5 mg x 7 days  15 mg x 7 days  12.5 mg x 7 days  10 mg x 7 days  7.5 mg x 7 days  5 mg x 7 days  2.5 mg x 7 days   Instructed patient that if pain returns and is severe, we can go back up to the next highest dose on the taper schedule for 7 days and then taper down from there. Patient called  [facility] who said they can see her in 6-8 weeks for further evaluation/ recommendations. Will hold off on any imaging for now until patient can see Rheum. Advised patient to continue with at home PT exercises as tolerated and consider if she would like to try dry needling. Continue monitoring BP and BG daily and notify of abnormal readings. Provided patient with written scheduled of taper plan and practiced teach-back for the dosing schedule in office today. Patient verbalized understanding and was in agreement with the plan. Will follow up with patient in 8 weeks.    Postmenopausal bleeding Assessment & Plan: Patient was seen and evaluated by OB/GYN. TVUS revealed endometrial thickness of 4 cm. Patient reported that OB would like to do endometrial biopsy given that her thickness is on the cusp of normal vs abnormal. Patient verbalized today that with all she has going on with PMR diagnosis, she would like to  hold off on endometrial biopsy at this time. Advised patient that I agree with her OB's recommendation to go through with the biopsy, however this decision is up to her personal preference. Advised patient to notify if bleeding returns or if she experiences new symptoms. Patient verbalized understanding and was in agreement with the plan.   Other orders -     predniSONE ; 20 mg x 7 days 17.5 mg x 7 days 15 mg x 7 days 12.5 mg x 7 days 10 mg x 7 days 7.5 mg x 7 days 5 mg x 7 days 2.5 mg x 7 days  Dispense: 7 tablet; Refill: 0 -     predniSONE ; 20 mg x 7 days 17.5 mg x 7 days 15 mg x 7 days 12.5 mg x 7 days 10 mg x 7 days 7.5 mg x 7 days 5 mg x 7 days 2.5 mg x 7 days  Dispense: 28 tablet; Refill: 0 -     predniSONE ; 20 mg x 7 days 17.5 mg x 7 days 15 mg x 7 days 12.5 mg x 7 days 10 mg x 7 days 7.5 mg x 7 days 5 mg x 7 days 2.5 mg x 7 days  Dispense: 28 tablet; Refill: 0 -     predniSONE ; 20 mg x 7 days 17.5 mg x 7 days 15 mg x 7 days 12.5 mg x 7 days 10 mg x 7 days 7.5 mg x 7 days 5 mg x 7  days 2.5 mg x 7 days  Dispense: 28 tablet; Refill: 0    Return in about 8  weeks (around 08/17/2024) for PMR follow up/steroid taper.    Saddie JULIANNA Sacks, PA-C

## 2024-06-22 NOTE — Assessment & Plan Note (Addendum)
 Given insidious onset of symptoms combined with fact that pain is localized to shoulder and hip girdle and the patient responded well to a steroid taper, will continue workup for polymyalgia rheumatica (PMR).  Autoimmune labs were normal. ESR/CRP normal, however patient was on a steroid when the labs were drawn, so potential for them to be falsely negative, however risk of stopping steroids to recheck labs much higher than continuing with steroid therapy.  Begin steroid taper today now that pain is improved after 25 mg prednisone  x 7 days. Will follow the following taper plan:  20 mg x 7 days  17.5 mg x 7 days  15 mg x 7 days  12.5 mg x 7 days  10 mg x 7 days  7.5 mg x 7 days  5 mg x 7 days  2.5 mg x 7 days   Instructed patient that if pain returns and is severe, we can go back up to the next highest dose on the taper schedule for 7 days and then taper down from there. Patient called [facility] who said they can see her in 6-8 weeks for further evaluation/ recommendations. Will hold off on any imaging for now until patient can see Rheum. Advised patient to continue with at home PT exercises as tolerated and consider if she would like to try dry needling. Continue monitoring BP and BG daily and notify of abnormal readings. Provided patient with written scheduled of taper plan and practiced teach-back for the dosing schedule in office today. Patient verbalized understanding and was in agreement with the plan. Will follow up with patient in 8 weeks.

## 2024-06-22 NOTE — Patient Instructions (Signed)
 It was nice to see you today!  As we discussed in clinic:  -I have included the prednisone  taper schedule with this paperwork. We will follow the following taper plan:   20 mg x 7 days  17.5 mg x 7 days  15 mg x 7 days  12.5 mg x 7 days  10 mg x 7 days  7.5 mg x 7 days  5 mg x 7 days  2.5 mg x 7 days   -If pain returns severely during one dose of the taper plan, let me know and we can increase to the next highest dose for 7 days and then plan to re-taper after that.  -Let me know when you get your appointment scheduled with rheumatology  -Be thinking about if dry needling is something you would be interested in and let me know  -I will plan on seeing you back in 8 weeks, or sooner as needed! Be safe traveling to [place]!   If you have any problems before your next visit feel free to message me via MyChart (minor issues or questions) or call the office, otherwise you may reach out to schedule an office visit.  Thank you! Saddie Sacks, PA-C

## 2024-06-22 NOTE — Assessment & Plan Note (Signed)
 Patient was seen and evaluated by OB/GYN. TVUS revealed endometrial thickness of 4 cm. Patient reported that OB would like to do endometrial biopsy given that her thickness is on the cusp of normal vs abnormal. Patient verbalized today that with all she has going on with PMR diagnosis, she would like to  hold off on endometrial biopsy at this time. Advised patient that I agree with her OB's recommendation to go through with the biopsy, however this decision is up to her personal preference. Advised patient to notify if bleeding returns or if she experiences new symptoms. Patient verbalized understanding and was in agreement with the plan.

## 2024-07-27 ENCOUNTER — Other Ambulatory Visit: Payer: Self-pay | Admitting: Cardiovascular Disease

## 2024-07-27 ENCOUNTER — Ambulatory Visit: Admitting: Advanced Practice Midwife

## 2024-07-27 ENCOUNTER — Other Ambulatory Visit: Payer: Self-pay | Admitting: Family Medicine

## 2024-07-27 DIAGNOSIS — E1140 Type 2 diabetes mellitus with diabetic neuropathy, unspecified: Secondary | ICD-10-CM

## 2024-08-03 ENCOUNTER — Telehealth: Payer: Self-pay

## 2024-08-03 NOTE — Telephone Encounter (Signed)
 Pt refused to speak to the triage nurse regarding this issue. I explained that they can advise what to do in the moment and also send a message to the clinical team/provider. Pt calling in concerned about BS readings after a recent increase on steroid medication. Pt reports yesterdays readings were  117 Before breakfast 189 after breakfast 230 4 hrs after lunch 8 pm Over 300 after dinner 11pm 271  Pt reports she increase taking 1500mg  about 5/6 days ago. Dr Chandra recommends that she increase to taking 2000 mg a day. So add 1 more tab on the second dose until she follows up on the 9th with [place].

## 2024-08-04 MED ORDER — METFORMIN HCL 1000 MG PO TABS: 1000.0000 mg | ORAL_TABLET | Freq: Two times a day (BID) | ORAL | 2 refills | Status: AC

## 2024-08-04 MED ORDER — EMPAGLIFLOZIN 10 MG PO TABS: 10.0000 mg | ORAL_TABLET | Freq: Every day | ORAL | 0 refills | Status: DC

## 2024-08-18 ENCOUNTER — Ambulatory Visit

## 2024-08-27 ENCOUNTER — Other Ambulatory Visit: Payer: Self-pay

## 2024-09-10 ENCOUNTER — Ambulatory Visit

## 2024-09-10 DIAGNOSIS — Z0000 Encounter for general adult medical examination without abnormal findings: Secondary | ICD-10-CM

## 2024-09-10 NOTE — Patient Instructions (Signed)
 Ms. Nordell,  Thank you for taking the time for your Medicare Wellness Visit. I appreciate your continued commitment to your health goals. Please review the care plan we discussed, and feel free to reach out if I can assist you further.  Medicare recommends these wellness visits once per year to help you and your care team stay ahead of potential health issues. These visits are designed to focus on prevention, allowing your provider to concentrate on managing your acute and chronic conditions during your regular appointments.  Please note that Annual Wellness Visits do not include a physical exam. Some assessments may be limited, especially if the visit was conducted virtually. If needed, we may recommend a separate in-person follow-up with your provider.  Ongoing Care Seeing your primary care provider every 3 to 6 months helps us  monitor your health and provide consistent, personalized care.   Referrals If a referral was made during today's visit and you haven't received any updates within two weeks, please contact the referred provider directly to check on the status.  Recommended Screenings:  Health Maintenance  Topic Date Due   Hepatitis C Screening  Never done   Pneumococcal Vaccine for age over 82 (1 of 2 - PCV) Never done   Flu Shot  07/10/2024   COVID-19 Vaccine (7 - 2025-26 season) 08/10/2024   Complete foot exam   09/30/2024   Cologuard (Stool DNA test)  10/12/2024   Hemoglobin A1C  11/01/2024   Breast Cancer Screening  11/27/2024   Yearly kidney health urinalysis for diabetes  02/02/2025   Eye exam for diabetics  04/21/2025   Yearly kidney function blood test for diabetes  05/01/2025   Medicare Annual Wellness Visit  09/10/2025   DTaP/Tdap/Td vaccine (2 - Td or Tdap) 02/06/2028   DEXA scan (bone density measurement)  04/09/2033   Zoster (Shingles) Vaccine  Completed   HPV Vaccine  Aged Out   Meningitis B Vaccine  Aged Out       09/10/2024    3:44 PM  Advanced  Directives  Does Patient Have a Medical Advance Directive? No   Advance Care Planning is important because it: Ensures you receive medical care that aligns with your values, goals, and preferences. Provides guidance to your family and loved ones, reducing the emotional burden of decision-making during critical moments.  Vision: Annual vision screenings are recommended for early detection of glaucoma, cataracts, and diabetic retinopathy. These exams can also reveal signs of chronic conditions such as diabetes and high blood pressure.  Dental: Annual dental screenings help detect early signs of oral cancer, gum disease, and other conditions linked to overall health, including heart disease and diabetes.  Please see the attached documents for additional preventive care recommendations.

## 2024-09-10 NOTE — Progress Notes (Signed)
 Subjective:   Susan Sandoval is a 68 y.o. who presents for a Medicare Wellness preventive visit.  As a reminder, Annual Wellness Visits don't include a physical exam, and some assessments may be limited, especially if this visit is performed virtually. We may recommend an in-person follow-up visit with your provider if needed.  Visit Complete: Virtual I connected with  Susan Sandoval on 09/10/24 by a audio enabled telemedicine application and verified that I am speaking with the correct person using two identifiers.  Patient Location: Home  Provider Location: Home Office  I discussed the limitations of evaluation and management by telemedicine. The patient expressed understanding and agreed to proceed.  Vital Signs: Because this visit was a virtual/telehealth visit, some criteria may be missing or patient reported. Any vitals not documented were not able to be obtained and vitals that have been documented are patient reported.  VideoError- Librarian, academic were attempted between this provider and patient, however failed, due to patient having technical difficulties OR patient did not have access to video capability.  We continued and completed visit with audio only.   Persons Participating in Visit: Patient.  AWV Questionnaire: No: Patient Medicare AWV questionnaire was not completed prior to this visit.  Cardiac Risk Factors include: advanced age (>39men, >25 women);diabetes mellitus;dyslipidemia;hypertension     Objective:    Today's Vitals   There is no height or weight on file to calculate BMI.     09/10/2024    3:44 PM 05/21/2024    2:24 PM 04/01/2023   10:23 AM 12/09/2019    2:17 PM  Advanced Directives  Does Patient Have a Medical Advance Directive? No No No No  Would patient like information on creating a medical advance directive?  No - Patient declined No - Patient declined No - Patient declined    Current Medications  (verified) Outpatient Encounter Medications as of 09/10/2024  Medication Sig   ACCU-CHEK GUIDE TEST test strip USE AS INSTRUCTED   Accu-Chek Softclix Lancets lancets USE TO CHECK BLOOD SUGARS  EVERY MORNING FASTING AND 2HOURS AFTER LARGEST MEAL   calcium  carbonate (OS-CAL) 1250 (500 Ca) MG chewable tablet Chew 1 tablet by mouth daily.   clobetasol  (TEMOVATE ) 0.05 % external solution Apply topically 2 (two) times daily.   enalapril  (VASOTEC ) 10 MG tablet TAKE 1 TABLET DAILY   hydrochlorothiazide  (HYDRODIURIL ) 25 MG tablet Take 1 tablet (25 mg total) by mouth daily.   JARDIANCE  10 MG TABS tablet TAKE 1 TABLET BY MOUTH DAILY BEFORE BREAKFAST.   Lancets Misc. (ACCU-CHEK SOFTCLIX LANCET DEV) KIT Use to check blood sugars every morning fasting and 2 hours after largest meal   metFORMIN  (GLUCOPHAGE ) 1000 MG tablet Take 1 tablet (1,000 mg total) by mouth 2 (two) times daily with a meal.   methimazole (TAPAZOLE) 5 MG tablet Take 6.5 mg by mouth daily.   metoprolol  tartrate (LOPRESSOR ) 25 MG tablet Take 1 tablet (25 mg total) by mouth as needed (FOR PALPITATIONS).   predniSONE  (DELTASONE ) 10 MG tablet 20 mg x 7 days 17.5 mg x 7 days 15 mg x 7 days 12.5 mg x 7 days 10 mg x 7 days 7.5 mg x 7 days 5 mg x 7 days 2.5 mg x 7 days   predniSONE  (DELTASONE ) 2.5 MG tablet 20 mg x 7 days 17.5 mg x 7 days 15 mg x 7 days 12.5 mg x 7 days 10 mg x 7 days 7.5 mg x 7 days 5 mg x 7  days 2.5 mg x 7 days   predniSONE  (DELTASONE ) 20 MG tablet 20 mg x 7 days 17.5 mg x 7 days 15 mg x 7 days 12.5 mg x 7 days 10 mg x 7 days 7.5 mg x 7 days 5 mg x 7 days 2.5 mg x 7 days   predniSONE  (DELTASONE ) 5 MG tablet 20 mg x 7 days 17.5 mg x 7 days 15 mg x 7 days 12.5 mg x 7 days 10 mg x 7 days 7.5 mg x 7 days 5 mg x 7 days 2.5 mg x 7 days   traZODone  (DESYREL ) 50 MG tablet TAKE 1/2 TO 1 TABLET AT    BEDTIME AS NEEDED FOR SLEEP   No facility-administered encounter medications on file as of 09/10/2024.    Allergies (verified) Diltiazem  and  Statins   History: Past Medical History:  Diagnosis Date   Diabetes mellitus without complication (HCC)    Hyperlipidemia    Hypertension    Past Surgical History:  Procedure Laterality Date   CESAREAN SECTION     CHOLECYSTECTOMY     EYE SURGERY     childhood   SPINE SURGERY     discectomy/ laminectomy L4-L5   WRIST SURGERY Right    Family History  Problem Relation Age of Onset   Hypertension Mother    Alzheimer's disease Mother    Diabetes Father    Hypertension Father    Stroke Father    Diabetes Sister    Hypertension Sister    Hypertension Maternal Grandmother    Cancer Maternal Grandmother        intestinal    Alcohol abuse Maternal Grandfather    Stroke Maternal Grandfather    Stroke Paternal Grandmother    Hypertension Paternal Grandfather    Stroke Paternal Grandfather    Social History   Socioeconomic History   Marital status: Married    Spouse name: Not on file   Number of children: 2   Years of education: Not on file   Highest education level: Not on file  Occupational History   Occupation: Retired Dietitian  Tobacco Use   Smoking status: Never    Passive exposure: Never   Smokeless tobacco: Never  Vaping Use   Vaping status: Never Used  Substance and Sexual Activity   Alcohol use: Not Currently    Alcohol/week: 2.0 standard drinks of alcohol    Types: 2 Glasses of wine per week   Drug use: Never   Sexual activity: Yes  Other Topics Concern   Not on file  Social History Narrative   Not on file   Social Drivers of Health   Financial Resource Strain: Low Risk  (09/10/2024)   Overall Financial Resource Strain (CARDIA)    Difficulty of Paying Living Expenses: Not hard at all  Food Insecurity: No Food Insecurity (09/10/2024)   Hunger Vital Sign    Worried About Running Out of Food in the Last Year: Never true    Ran Out of Food in the Last Year: Never true  Transportation Needs: No Transportation Needs (09/10/2024)   PRAPARE -  Administrator, Civil Service (Medical): No    Lack of Transportation (Non-Medical): No  Physical Activity: Inactive (09/10/2024)   Exercise Vital Sign    Days of Exercise per Week: 0 days    Minutes of Exercise per Session: 0 min  Stress: Stress Concern Present (09/10/2024)   [employer] of Occupational Health - Occupational Stress Questionnaire    Feeling  of Stress: To some extent  Social Connections: Moderately Isolated (09/10/2024)   Social Connection and Isolation Panel    Frequency of Communication with Friends and Family: More than three times a week    Frequency of Social Gatherings with Friends and Family: More than three times a week    Attends Religious Services: Never    Database administrator or Organizations: No    Attends Engineer, structural: Never    Marital Status: Married    Tobacco Counseling Counseling given: Not Answered    Clinical Intake:  Pre-visit preparation completed: Yes  Pain : No/denies pain     Nutritional Risks: None Diabetes: Yes CBG done?: No Did pt. bring in CBG monitor from home?: No  Lab Results  Component Value Date   HGBA1C 5.9 (H) 05/01/2024   HGBA1C 6.3 (H) 01/27/2024   HGBA1C 5.9 (H) 09/24/2023     How often do you need to have someone help you when you read instructions, pamphlets, or other written materials from your doctor or pharmacy?: 1 - Never  Interpreter Needed?: No  Information entered by :: NAllen LPN   Activities of Daily Living     09/10/2024    3:37 PM  In your present state of health, do you have any difficulty performing the following activities:  Hearing? 0  Vision? 0  Difficulty concentrating or making decisions? 0  Walking or climbing stairs? 0  Dressing or bathing? 0  Doing errands, shopping? 0  Preparing Food and eating ? N  Using the Toilet? N  In the past six months, have you accidently leaked urine? N  Do you have problems with loss of bowel control? N  Managing  your Medications? N  Managing your Finances? N  Housekeeping or managing your Housekeeping? N    Patient Care Team: Gayle Saddie JULIANNA DEVONNA as PCP - General (Physician Assistant) O'Neal, Darryle Ned, MD as PCP - Cardiology (Cardiology) Evern Saint Dennis, MD as Referring Physician (Endocrinology)  I have updated your Care Teams any recent Medical Services you may have received from other providers in the past year.     Assessment:   This is a routine wellness examination for Susan Sandoval.  Hearing/Vision screen Hearing Screening - Comments:: Denies hearing issues Vision Screening - Comments:: Regular eye exams, Dr. Estelle   Goals Addressed             This Visit's Progress    Patient Stated       09/10/2024, wants to get through prednisone  treatments, wants to get off metformin        Depression Screen     09/10/2024    3:47 PM 06/22/2024   11:07 AM 06/08/2024    2:33 PM 06/03/2024    4:14 PM 05/07/2024   10:16 AM 02/03/2024    1:25 PM 10/01/2023    9:53 AM  PHQ 2/9 Scores  PHQ - 2 Score 0 4 3 6 5 1 2   PHQ- 9 Score 4 11 8 15 12 3 4     Fall Risk     09/10/2024    3:45 PM 06/22/2024   11:09 AM 06/03/2024    4:14 PM 05/07/2024   10:16 AM 02/03/2024    1:25 PM  Fall Risk   Falls in the past year? 1 0 0 0 0  Comment twisted ankle      Number falls in past yr: 0   0 0  Injury with Fall? 0   0 0  Risk for fall due to : Medication side effect No Fall Risks  No Fall Risks No Fall Risks  Follow up Falls evaluation completed;Falls prevention discussed Falls evaluation completed Falls evaluation completed Falls evaluation completed Falls evaluation completed    MEDICARE RISK AT HOME:  Medicare Risk at Home Any stairs in or around the home?: Yes If so, are there any without handrails?: No Home free of loose throw rugs in walkways, pet beds, electrical cords, etc?: Yes Adequate lighting in your home to reduce risk of falls?: Yes Life alert?: No Use of a cane, walker or  w/c?: No Grab bars in the bathroom?: Yes Shower chair or bench in shower?: Yes Elevated toilet seat or a handicapped toilet?: No  TIMED UP AND GO:  Was the test performed?  No  Cognitive Function: 6CIT completed        09/10/2024    3:48 PM 04/01/2023   10:21 AM 09/28/2021   10:37 AM  6CIT Screen  What Year? 0 points 0 points 0 points  What month? 0 points 0 points 0 points  What time? 0 points 0 points 0 points  Count back from 20 0 points 0 points 0 points  Months in reverse 0 points 0 points 0 points  Repeat phrase 0 points 0 points 0 points  Total Score 0 points 0 points 0 points    Immunizations Immunization History  Administered Date(s) Administered   Fluad Quad(high Dose 65+) 09/28/2021   Influenza-Unspecified 09/10/2019, 09/14/2020, 08/29/2022   PFIZER(Purple Top)SARS-COV-2 Vaccination 02/23/2020, 03/15/2020, 09/14/2020, 03/16/2021, 08/25/2021   Pfizer Covid-19 Vaccine Bivalent Booster 5y-11y 08/29/2022   Tdap 02/05/2018   Zoster Recombinant(Shingrix) 09/24/2019, 08/29/2022    Screening Tests Health Maintenance  Topic Date Due   Hepatitis C Screening  Never done   Pneumococcal Vaccine: 50+ Years (1 of 2 - PCV) Never done   Influenza Vaccine  07/10/2024   COVID-19 Vaccine (7 - 2025-26 season) 08/10/2024   FOOT EXAM  09/30/2024   Fecal DNA (Cologuard)  10/12/2024   HEMOGLOBIN A1C  11/01/2024   Mammogram  11/27/2024   Diabetic kidney evaluation - Urine ACR  02/02/2025   OPHTHALMOLOGY EXAM  04/21/2025   Diabetic kidney evaluation - eGFR measurement  05/01/2025   Medicare Annual Wellness (AWV)  09/10/2025   DTaP/Tdap/Td (2 - Td or Tdap) 02/06/2028   DEXA SCAN  04/09/2033   Zoster Vaccines- Shingrix  Completed   HPV VACCINES  Aged Out   Meningococcal B Vaccine  Aged Out    Health Maintenance Items Addressed: Vaccines Due: flu and covid , states had pneumonia in [place]   Additional Screening:  Vision Screening: Recommended annual ophthalmology exams for  early detection of glaucoma and other disorders of the eye. Is the patient up to date with their annual eye exam?  Yes  Who is the provider or what is the name of the office in which the patient attends annual eye exams? Dr. Estelle  Dental Screening: Recommended annual dental exams for proper oral hygiene  Community Resource Referral / Chronic Care Management: CRR required this visit?  No   CCM required this visit?  No   Plan:    I have personally reviewed and noted the following in the patient's chart:   Medical and social history Use of alcohol, tobacco or illicit drugs  Current medications and supplements including opioid prescriptions. Patient is not currently taking opioid prescriptions. Functional ability and status Nutritional status Physical activity Advanced directives List of other physicians Hospitalizations, surgeries, and  ER visits in previous 12 months Vitals Screenings to include cognitive, depression, and falls Referrals and appointments  In addition, I have reviewed and discussed with patient certain preventive protocols, quality metrics, and best practice recommendations. A written personalized care plan for preventive services as well as general preventive health recommendations were provided to patient.   Ardella FORBES Dawn, LPN   [id number]   After Visit Summary: (MyChart) Due to this being a telephonic visit, the after visit summary with patients personalized plan was offered to patient via MyChart   Notes: Nothing significant to report at this time.

## 2024-09-28 ENCOUNTER — Other Ambulatory Visit: Payer: Self-pay

## 2024-09-28 MED ORDER — EMPAGLIFLOZIN 10 MG PO TABS: 10.0000 mg | ORAL_TABLET | Freq: Every day | ORAL | 2 refills | Status: DC

## 2024-10-12 ENCOUNTER — Encounter: Payer: Self-pay | Admitting: Radiology

## 2024-10-29 ENCOUNTER — Other Ambulatory Visit: Payer: Self-pay

## 2024-10-29 DIAGNOSIS — E1159 Type 2 diabetes mellitus with other circulatory complications: Secondary | ICD-10-CM

## 2024-10-29 DIAGNOSIS — E1140 Type 2 diabetes mellitus with diabetic neuropathy, unspecified: Secondary | ICD-10-CM

## 2024-11-03 ENCOUNTER — Other Ambulatory Visit

## 2024-11-03 DIAGNOSIS — E1159 Type 2 diabetes mellitus with other circulatory complications: Secondary | ICD-10-CM

## 2024-11-03 DIAGNOSIS — E1140 Type 2 diabetes mellitus with diabetic neuropathy, unspecified: Secondary | ICD-10-CM

## 2024-11-04 ENCOUNTER — Ambulatory Visit: Payer: Self-pay

## 2024-11-04 LAB — COMPREHENSIVE METABOLIC PANEL WITH GFR
ALT: 25 IU/L (ref 0–32)
AST: 15 IU/L (ref 0–40)
Albumin: 4.6 g/dL (ref 3.9–4.9)
Alkaline Phosphatase: 43 IU/L — ABNORMAL LOW (ref 49–135)
BUN/Creatinine Ratio: 30 — ABNORMAL HIGH (ref 12–28)
BUN: 21 mg/dL (ref 8–27)
Bilirubin Total: 0.4 mg/dL (ref 0.0–1.2)
CO2: 25 mmol/L (ref 20–29)
Calcium: 10 mg/dL (ref 8.7–10.3)
Chloride: 99 mmol/L (ref 96–106)
Creatinine, Ser: 0.71 mg/dL (ref 0.57–1.00)
Globulin, Total: 2.1 g/dL (ref 1.5–4.5)
Glucose: 95 mg/dL (ref 70–99)
Potassium: 3 mmol/L — ABNORMAL LOW (ref 3.5–5.2)
Sodium: 143 mmol/L (ref 134–144)
Total Protein: 6.7 g/dL (ref 6.0–8.5)
eGFR: 93 mL/min/1.73 (ref >59)

## 2024-11-04 LAB — CBC WITH DIFFERENTIAL/PLATELET
Basophils Absolute: 0.1 x10E3/uL (ref 0.0–0.2)
Basos: 1 %
EOS (ABSOLUTE): 0.1 x10E3/uL (ref 0.0–0.4)
Eos: 1 %
Hematocrit: 50.3 % — ABNORMAL HIGH (ref 34.0–46.6)
Hemoglobin: 17 g/dL — ABNORMAL HIGH (ref 11.1–15.9)
Immature Grans (Abs): 0.1 x10E3/uL (ref 0.0–0.1)
Immature Granulocytes: 1 %
Lymphocytes Absolute: 2.4 x10E3/uL (ref 0.7–3.1)
Lymphs: 23 %
MCH: 32.5 pg (ref 26.6–33.0)
MCHC: 33.8 g/dL (ref 31.5–35.7)
MCV: 96 fL (ref 79–97)
Monocytes Absolute: 0.7 x10E3/uL (ref 0.1–0.9)
Monocytes: 7 %
Neutrophils Absolute: 7.4 x10E3/uL — ABNORMAL HIGH (ref 1.4–7.0)
Neutrophils: 67 %
Platelets: 250 x10E3/uL (ref 150–450)
RBC: 5.23 x10E6/uL (ref 3.77–5.28)
RDW: 12.9 % (ref 11.7–15.4)
WBC: 10.8 x10E3/uL (ref 3.4–10.8)

## 2024-11-04 LAB — LIPID PANEL
Chol/HDL Ratio: 4.8 ratio — ABNORMAL HIGH (ref 0.0–4.4)
Cholesterol, Total: 270 mg/dL — ABNORMAL HIGH (ref 100–199)
HDL: 56 mg/dL (ref >39)
LDL Chol Calc (NIH): 179 mg/dL — ABNORMAL HIGH (ref 0–99)
Triglycerides: 187 mg/dL — ABNORMAL HIGH (ref 0–149)
VLDL Cholesterol Cal: 35 mg/dL (ref 5–40)

## 2024-11-04 LAB — HEMOGLOBIN A1C
Est. average glucose Bld gHb Est-mCnc: 160 mg/dL
Hgb A1c MFr Bld: 7.2 % — ABNORMAL HIGH (ref 4.8–5.6)

## 2024-11-04 MED ORDER — POTASSIUM CHLORIDE ER 10 MEQ PO TBCR: 10.0000 meq | EXTENDED_RELEASE_TABLET | Freq: Every day | ORAL | 0 refills | Status: AC

## 2024-11-10 ENCOUNTER — Ambulatory Visit

## 2024-11-10 VITALS — BP 128/79 | HR 86 | Temp 98.0°F | Ht 68.0 in | Wt 180.1 lb

## 2024-11-10 DIAGNOSIS — Z1231 Encounter for screening mammogram for malignant neoplasm of breast: Secondary | ICD-10-CM

## 2024-11-10 DIAGNOSIS — E876 Hypokalemia: Secondary | ICD-10-CM | POA: Diagnosis not present

## 2024-11-10 DIAGNOSIS — E1169 Type 2 diabetes mellitus with other specified complication: Secondary | ICD-10-CM | POA: Diagnosis not present

## 2024-11-10 DIAGNOSIS — E1140 Type 2 diabetes mellitus with diabetic neuropathy, unspecified: Secondary | ICD-10-CM

## 2024-11-10 DIAGNOSIS — E785 Hyperlipidemia, unspecified: Secondary | ICD-10-CM

## 2024-11-10 DIAGNOSIS — Z1211 Encounter for screening for malignant neoplasm of colon: Secondary | ICD-10-CM

## 2024-11-10 DIAGNOSIS — M353 Polymyalgia rheumatica: Secondary | ICD-10-CM | POA: Insufficient documentation

## 2024-11-10 DIAGNOSIS — R21 Rash and other nonspecific skin eruption: Secondary | ICD-10-CM | POA: Insufficient documentation

## 2024-11-10 DIAGNOSIS — E1159 Type 2 diabetes mellitus with other circulatory complications: Secondary | ICD-10-CM | POA: Diagnosis not present

## 2024-11-10 DIAGNOSIS — I152 Hypertension secondary to endocrine disorders: Secondary | ICD-10-CM

## 2024-11-10 MED ORDER — EMPAGLIFLOZIN 25 MG PO TABS: 25.0000 mg | ORAL_TABLET | Freq: Every day | ORAL | 3 refills | Status: AC

## 2024-11-10 NOTE — Patient Instructions (Signed)
 VISIT SUMMARY: Today, we reviewed your medication regimen and addressed side effects related to your current treatments for rheumatoid arthritis and type 2 diabetes. We also discussed your recent lab results and made adjustments to your medications to better manage your conditions.  YOUR PLAN: SJGREN'S SYNDROME: You are experiencing significant dryness in your mouth and eyes, hair loss, and skin issues due to your current steroid taper. -Continue your current steroid taper as prescribed by rheumatology. -Use moisturizing eye drops as needed for dry eyes. -Apply Desitin to painful skin lesions on your buttocks. -Avoid using topical steroids unless absolutely necessary.  TYPE 2 DIABETES MELLITUS: Your recent increase in A1c is likely due to steroid use. Your fasting blood sugars have been generally well-controlled but have been elevated recently. -Increase Jardiance  to 25 mg daily. -Monitor your blood sugars closely. -Continue your current metformin  regimen. -Be aware of the signs of low blood sugar and adjust your medication as needed.  ESSENTIAL HYPERTENSION: Your blood pressure has been generally well-controlled but variable, possibly due to potassium levels and Jardiance . -Monitor your blood pressure regularly. -Be aware of potential blood pressure changes with the increased Jardiance .  HYPOKALEMIA: You have had recent low potassium levels, likely due to steroid use. Potassium supplements have improved your blood pressure. -We rechecked your potassium levels today. -Continue taking potassium supplements until your levels stabilize. -Increase your dietary potassium intake.  SKIN DISORDER: You have painful skin lesions on your buttocks and back, likely related to steroid use. -Apply Desitin to painful lesions on your buttocks. -Avoid using Neosporin unless absolutely necessary for severe discomfort.  GENERAL HEALTH MAINTENANCE: Your routine health maintenance is up to date with recent  flu and COVID vaccinations. Mammogram and Cologuard screenings are due. -A mammogram has been ordered at your preferred facility. -A Cologuard screening has been ordered.  If you have any problems before your next visit feel free to message me via MyChart (minor issues or questions) or call the office, otherwise you may reach out to schedule an office visit.  Thank you! Saddie Sacks, PA-C

## 2024-11-10 NOTE — Assessment & Plan Note (Signed)
 Rash noted to buttocks that is red and painful. Advised Desitin use BID x 7-10 days and monitor for improvement.

## 2024-11-10 NOTE — Assessment & Plan Note (Signed)
 BP goal <130/80. Stable, at goal. Continue enalapril  10 mg daily, hydrochlorothiazide  25 mg daily, ambulatory blood pressure monitoring.  Continue routine follow-up with cardiology.

## 2024-11-10 NOTE — Assessment & Plan Note (Signed)
 Following with rheumatology. Is currently on a prednisone  taper and pain is well controlled. Continue 12.5 mg prednisone  daily as prescribed by rheumatology with plan to taper by 2.5 mg every 4 weeks.

## 2024-11-10 NOTE — Progress Notes (Signed)
 Established Patient Office Visit  Subjective   Patient ID: Susan Sandoval, female    DOB: Apr 27, 1956  Age: 68 y.o. MRN: [id number]  Chief Complaint  Patient presents with   Medical Management of Chronic Issues    HPI  History of Present Illness   Susan Sandoval is a 68 year old female with PMR and type 2 diabetes who presents for follow-up regarding her medication regimen and side effects.  Glucocorticoid therapy adverse effects / polymyalgia rheumatica  - Currently on a tapering dose of prednisone , now at 12.5 mg daily - Significant xerostomia and xerophthalmia that has been bothering her recently  - Blurry vision attributed to ocular dryness; uses moisturizing drops - Weakness is still present - Pain is well-controlled - Does report painful sore on her buttock that has been bothering her for about a week. Reports that she attributed this to the steroids and has been using neosporin as needed which has been helping some   Glycemic control - Type 2 diabetes mellitus - Blood glucose levels fluctuating since starting prednisone  - Fasting glucose previously maintained below 120 mg/dL prior to prednisone  - Currently taking Jardiance  10 mg and metformin  1000 mg BID  - Plans to increase Jardiance  to 25 mg - Monitors blood glucose closely; significant rises without Jardiance  - Recent laboratory results showed elevated A1c (7.2)   Electrolyte disturbance - Recent laboratory results showed hypokalemia - Taking potassium supplement as prescribed with 4 days left   Blood pressure variability and cardiac symptoms - Blood pressure has been variable, with some high readings - Improvement in blood pressure since starting potassium supplements - Not currently taking metoprolol ; reserves for episodes of supraventricular tachycardia  Hyperlipidemia - Recent laboratory results showed elevated cholesterol, higher than baseline  - Not currently on cholesterol medication due to intolerance in  the past   Immunization and infectious exposure - Received influenza and COVID vaccinations  Ophthalmologic follow-up - Blurry vision attributed to dryness - Upcoming appointment with ophthalmologist this Friday  - Using lubricating eye drops as needed         ROS Per HPI.    Objective:     BP 128/79   Pulse 86   Temp 98 F (36.7 C) (Oral)   Ht 5' 8 (1.727 m)   Wt 180 lb 1.9 oz (81.7 kg)   SpO2 98%   BMI 27.39 kg/m    Physical Exam Constitutional:      General: She is not in acute distress.    Appearance: Normal appearance.  Cardiovascular:     Rate and Rhythm: Normal rate and regular rhythm.     Heart sounds: Normal heart sounds. No murmur heard.    No friction rub. No gallop.  Pulmonary:     Effort: Pulmonary effort is normal. No respiratory distress.     Breath sounds: Normal breath sounds.  Genitourinary:    Comments: Red, flat, plaque-like rash noted to buttock with no surrounding satellite lesions, discharge, or other abnormalities.  Musculoskeletal:        General: No swelling.     Cervical back: Neck supple.  Lymphadenopathy:     Cervical: No cervical adenopathy.  Skin:    General: Skin is warm and dry.  Neurological:     General: No focal deficit present.     Mental Status: She is alert.  Psychiatric:        Mood and Affect: Mood normal.        Behavior: Behavior normal.  Thought Content: Thought content normal.      No results found for any visits on 11/10/24.  Last CBC Lab Results  Component Value Date   WBC 10.8 11/03/2024   HGB 17.0 (H) 11/03/2024   HCT 50.3 (H) 11/03/2024   MCV 96 11/03/2024   MCH 32.5 11/03/2024   RDW 12.9 11/03/2024   PLT 250 11/03/2024   Last metabolic panel Lab Results  Component Value Date   GLUCOSE 95 11/03/2024   NA 143 11/03/2024   K 3.0 (L) 11/03/2024   CL 99 11/03/2024   CO2 25 11/03/2024   BUN 21 11/03/2024   CREATININE 0.71 11/03/2024   EGFR 93 11/03/2024   CALCIUM  10.0 11/03/2024    PROT 6.7 11/03/2024   ALBUMIN 4.6 11/03/2024   LABGLOB 2.1 11/03/2024   AGRATIO 1.9 03/27/2023   BILITOT 0.4 11/03/2024   ALKPHOS 43 (L) 11/03/2024   AST 15 11/03/2024   ALT 25 11/03/2024   Last lipids Lab Results  Component Value Date   CHOL 270 (H) 11/03/2024   HDL 56 11/03/2024   LDLCALC 179 (H) 11/03/2024   TRIG 187 (H) 11/03/2024   CHOLHDL 4.8 (H) 11/03/2024   Last hemoglobin A1c Lab Results  Component Value Date   HGBA1C 7.2 (H) 11/03/2024   Last thyroid  functions Lab Results  Component Value Date   TSH 1.360 05/01/2024   T3TOTAL 125 06/14/2020   FREET4 1.34 06/14/2020   Last vitamin D No results found for: 25OHVITD2, 25OHVITD3, VD25OH    The 10-year ASCVD risk score (Arnett DK, et al., 2019) is: 20.9%    Assessment & Plan:   Screening mammogram for breast cancer -     3D Screening Mammogram, Left and Right; Future  Screen for colon cancer -     Cologuard  Hypokalemia Assessment & Plan: Recent low potassium levels. Replaced with 10 mEq of Klor-Con  daily for 10 days. - Rechecked potassium levels today. - Continue potassium supplements until levels stabilize. - Encouraged dietary potassium intake.  Orders: -     Basic metabolic panel with GFR  Type 2 diabetes mellitus with diabetic neuropathy, without long-term current use of insulin (HCC) Assessment & Plan: A1c increased at 7.2. Likely due to chronic prednisone  but is tapering down on the dose. Continue Metformin  1000 mg BID and will increase Jardiance  from 10 mg to 25 mg daily  - Monitor blood sugars closely. - Maintain current metformin  regimen. - Educated on signs of hypoglycemia and medication adjustment.   Hypertension associated with type 2 diabetes mellitus (HCC) Assessment & Plan: BP goal <130/80. Stable, at goal. Continue enalapril  10 mg daily, hydrochlorothiazide  25 mg daily, ambulatory blood pressure monitoring.  Continue routine follow-up with cardiology.    Hyperlipidemia  associated with type 2 diabetes mellitus (HCC) Assessment & Plan: Last lipid panel: LDL 179, HDL 56, Trig 187. The 10-year ASCVD risk score (Arnett DK, et al., 2019) is: 20.9% Patient follows with cardiology. Has intolerance to statins and Zetia . Discussed elevation in LDL may be due to prednisone . Patient would like to recheck in 3 months after she has tapered down to 5 mg prednisone  daily. If LDL worsens or remains elevated, will need to discuss Repatha injections.    Rash Assessment & Plan: Rash noted to buttocks that is red and painful. Advised Desitin use BID x 7-10 days and monitor for improvement.    Polymyalgia rheumatica Assessment & Plan: Following with rheumatology. Is currently on a prednisone  taper and pain is well controlled. Continue 12.5  mg prednisone  daily as prescribed by rheumatology with plan to taper by 2.5 mg every 4 weeks.    Other orders -     Empagliflozin ; Take 1 tablet (25 mg total) by mouth daily.  Dispense: 90 tablet; Refill: 3    Return in about 3 months (around 02/08/2025) for HTN, HLD, DM.    Saddie JULIANNA Sacks, PA-C

## 2024-11-10 NOTE — Assessment & Plan Note (Signed)
 Recent low potassium levels. Replaced with 10 mEq of Klor-Con  daily for 10 days. - Rechecked potassium levels today. - Continue potassium supplements until levels stabilize. - Encouraged dietary potassium intake.

## 2024-11-10 NOTE — Assessment & Plan Note (Signed)
 A1c increased at 7.2. Likely due to chronic prednisone  but is tapering down on the dose. Continue Metformin  1000 mg BID and will increase Jardiance  from 10 mg to 25 mg daily  - Monitor blood sugars closely. - Maintain current metformin  regimen. - Educated on signs of hypoglycemia and medication adjustment.

## 2024-11-10 NOTE — Assessment & Plan Note (Signed)
 Last lipid panel: LDL 179, HDL 56, Trig 187. The 10-year ASCVD risk score (Arnett DK, et al., 2019) is: 20.9% Patient follows with cardiology. Has intolerance to statins and Zetia . Discussed elevation in LDL may be due to prednisone . Patient would like to recheck in 3 months after she has tapered down to 5 mg prednisone  daily. If LDL worsens or remains elevated, will need to discuss Repatha injections.

## 2024-11-11 ENCOUNTER — Ambulatory Visit: Payer: Self-pay

## 2024-11-11 LAB — BASIC METABOLIC PANEL WITH GFR
BUN/Creatinine Ratio: 33 — ABNORMAL HIGH (ref 12–28)
BUN: 25 mg/dL (ref 8–27)
CO2: 22 mmol/L (ref 20–29)
Calcium: 10.1 mg/dL (ref 8.7–10.3)
Chloride: 100 mmol/L (ref 96–106)
Creatinine, Ser: 0.75 mg/dL (ref 0.57–1.00)
Glucose: 187 mg/dL — ABNORMAL HIGH (ref 70–99)
Potassium: 3.9 mmol/L (ref 3.5–5.2)
Sodium: 140 mmol/L (ref 134–144)
eGFR: 87 mL/min/1.73 (ref >59)

## 2024-11-18 LAB — OPHTHALMOLOGY REPORT-SCANNED

## 2024-12-01 LAB — COLOGUARD: COLOGUARD: NEGATIVE

## 2024-12-15 ENCOUNTER — Telehealth: Payer: Self-pay | Admitting: Orthopedic Surgery

## 2024-12-15 NOTE — Telephone Encounter (Signed)
 Pt request a sooner appointment than 01/06/25 states she's having severe shoulder pain.

## 2025-01-06 ENCOUNTER — Ambulatory Visit: Admitting: Orthopedic Surgery

## 2025-01-06 ENCOUNTER — Other Ambulatory Visit: Payer: Self-pay

## 2025-01-06 DIAGNOSIS — M25511 Pain in right shoulder: Secondary | ICD-10-CM

## 2025-01-06 DIAGNOSIS — M7501 Adhesive capsulitis of right shoulder: Secondary | ICD-10-CM

## 2025-01-06 DIAGNOSIS — G8929 Other chronic pain: Secondary | ICD-10-CM

## 2025-01-07 ENCOUNTER — Encounter: Payer: Self-pay | Admitting: Orthopedic Surgery

## 2025-01-07 DIAGNOSIS — M7501 Adhesive capsulitis of right shoulder: Secondary | ICD-10-CM | POA: Diagnosis not present

## 2025-01-07 MED ORDER — BUPIVACAINE HCL 0.5 % IJ SOLN
9.0000 mL | INTRAMUSCULAR | Status: AC | PRN
  Administered 2025-01-07: 9 mL via INTRA_ARTICULAR

## 2025-01-07 MED ORDER — LIDOCAINE HCL 1 % IJ SOLN
5.0000 mL | INTRAMUSCULAR | Status: AC | PRN
  Administered 2025-01-07: 5 mL

## 2025-01-07 MED ORDER — TRIAMCINOLONE ACETONIDE 40 MG/ML IJ SUSP
40.0000 mg | INTRAMUSCULAR | Status: AC | PRN
  Administered 2025-01-07: 40 mg via INTRA_ARTICULAR

## 2025-01-07 NOTE — Progress Notes (Signed)
 "  Office Visit Note   Patient: Susan Sandoval           Date of Birth: Jun 16, 1956           MRN: [id number] Visit Date: 01/06/2025 Requested by: Gayle Saddie FALCON, PA-C [address] [place],  [place] [address] PCP: Gayle Saddie FALCON DEVONNA  Subjective: Chief Complaint  Patient presents with   Right Shoulder - Pain    HPI: Susan Sandoval is a 69 y.o. female who presents to the office reporting right shoulder pain and stiffness.  Been going on for about a month.  Using Tylenol arthritis for pain.  Really can only lift her arm up a limited amount until she starts to have pain.  She has been diagnosed with PMR.  Injection last year did help all of her joint pains.  Currently she is on prednisone .  Had a right glenohumeral joint injection in June 2025.  Pain is a bigger problem than stiffness at this time..                ROS: All systems reviewed are negative as they relate to the chief complaint within the history of present illness.  Patient denies fevers or chills.  Assessment & Plan: Visit Diagnoses:  1. Chronic right shoulder pain     Plan: Impression is recurrent right frozen shoulder.  No evidence of arthritis on radiographs from June of last year.  She does have somewhat limited range of motion but is not significant.  Repeat injection performed today.  Will see how she does with that.  Follow-up as needed.  Follow-Up Instructions: No follow-ups on file.   Orders:  Orders Placed This Encounter  Procedures   US  Guided Needle Placement - No Linked Charges   No orders of the defined types were placed in this encounter.     Procedures: Large Joint Inj: R glenohumeral on 01/07/2025 7:10 AM Indications: diagnostic evaluation and pain Details: 22 G 3.5 in needle, ultrasound-guided posterior approach  Arthrogram: No  Medications: 9 mL bupivacaine  0.5 %; 5 mL lidocaine  1 %; 40 mg triamcinolone  acetonide 40 MG/ML Outcome: tolerated well, no immediate complications Procedure,  treatment alternatives, risks and benefits explained, specific risks discussed. Consent was given by the patient. Immediately prior to procedure a time out was called to verify the correct patient, procedure, equipment, support staff and site/side marked as required. Patient was prepped and draped in the usual sterile fashion.       Clinical Data: No additional findings.  Objective: Vital Signs: There were no vitals taken for this visit.  Physical Exam:  Constitutional: Patient appears well-developed HEENT:  Head: Normocephalic Eyes:EOM are normal Neck: Normal range of motion Cardiovascular: Normal rate Pulmonary/chest: Effort normal Neurologic: Patient is alert Skin: Skin is warm Psychiatric: Patient has normal mood and affect  Ortho Exam: Ortho exam demonstrates range of motion on the right of 45/85/160 range of motion on the left 70/95/175.  Rotator cuff strength is intact infraspinatus supraspinatus and subscap muscle testing.  No discrete AC joint tenderness.  Cervical spine range of motion intact.  Motor or sensory function to the right arm intact.  No coarseness or grinding is present with internal or external rotation of that right arm.  Specialty Comments:  No specialty comments available.  Imaging: No results found.   PMFS History: Patient Active Problem List   Diagnosis Date Noted   Hypokalemia 11/10/2024   Rash 11/10/2024   Polymyalgia rheumatica 11/10/2024  Encounter for special screening examination for musculoskeletal disorder 06/03/2024   Postmenopausal bleeding 06/03/2024   Right hip pain 05/07/2024   SVT (supraventricular tachycardia) 02/03/2024   Elevated LDL cholesterol level 10/01/2023   Right shoulder pain 12/31/2022   Insomnia 01/30/2022   Hyperlipidemia associated with type 2 diabetes mellitus (HCC) 03/27/2021   Graves disease 06/16/2020   Psoriasis 05/21/2020   Hypertension associated with type 2 diabetes mellitus (HCC) 12/10/2019   GAD  (generalized anxiety disorder) 12/10/2019   Type 2 diabetes mellitus with diabetic neuropathy, unspecified (HCC) 12/09/2019   Past Medical History:  Diagnosis Date   Diabetes mellitus without complication (HCC)    Hyperlipidemia    Hypertension     Family History  Problem Relation Age of Onset   Hypertension Mother    Alzheimer's disease Mother    Diabetes Father    Hypertension Father    Stroke Father    Diabetes Sister    Hypertension Sister    Hypertension Maternal Grandmother    Cancer Maternal Grandmother        intestinal    Alcohol abuse Maternal Grandfather    Stroke Maternal Grandfather    Stroke Paternal Grandmother    Hypertension Paternal Grandfather    Stroke Paternal Grandfather     Past Surgical History:  Procedure Laterality Date   CESAREAN SECTION     CHOLECYSTECTOMY     EYE SURGERY     childhood   SPINE SURGERY     discectomy/ laminectomy L4-L5   WRIST SURGERY Right    Social History   Occupational History   Occupation: Retired Dietitian  Tobacco Use   Smoking status: Never    Passive exposure: Never   Smokeless tobacco: Never  Vaping Use   Vaping status: Never Used  Substance and Sexual Activity   Alcohol use: Not Currently    Alcohol/week: 2.0 standard drinks of alcohol    Types: 2 Glasses of wine per week   Drug use: Never   Sexual activity: Yes        "

## 2025-02-04 ENCOUNTER — Other Ambulatory Visit

## 2025-02-11 ENCOUNTER — Ambulatory Visit

## 2025-10-21 ENCOUNTER — Ambulatory Visit
